# Patient Record
Sex: Male | Born: 1980 | Race: White | Hispanic: No | Marital: Married | Smoking: Never smoker
Health system: Southern US, Community
[De-identification: ages and names within clinical notes are randomized; demographics above are authoritative.]

## PROBLEM LIST (undated history)

## (undated) DIAGNOSIS — N289 Disorder of kidney and ureter, unspecified: Secondary | ICD-10-CM

## (undated) DIAGNOSIS — F32A Depression, unspecified: Secondary | ICD-10-CM

## (undated) DIAGNOSIS — F419 Anxiety disorder, unspecified: Secondary | ICD-10-CM

## (undated) DIAGNOSIS — E119 Type 2 diabetes mellitus without complications: Secondary | ICD-10-CM

## (undated) DIAGNOSIS — F329 Major depressive disorder, single episode, unspecified: Secondary | ICD-10-CM

## (undated) HISTORY — DX: Depression, unspecified: F32.A

## (undated) HISTORY — DX: Major depressive disorder, single episode, unspecified: F32.9

## (undated) HISTORY — DX: Anxiety disorder, unspecified: F41.9

## (undated) HISTORY — DX: Type 2 diabetes mellitus without complications: E11.9

---

## 2014-03-15 ENCOUNTER — Encounter (HOSPITAL_COMMUNITY): Payer: Self-pay

## 2014-03-15 ENCOUNTER — Emergency Department (HOSPITAL_COMMUNITY)
Admission: EM | Admit: 2014-03-15 | Discharge: 2014-03-15 | Disposition: A | Discharge status: Home / Self Care | Payer: Self-pay | Attending: Emergency Medicine | Admitting: Emergency Medicine

## 2014-03-15 DIAGNOSIS — Y9289 Other specified places as the place of occurrence of the external cause: Secondary | ICD-10-CM | POA: Insufficient documentation

## 2014-03-15 DIAGNOSIS — X58XXXA Exposure to other specified factors, initial encounter: Secondary | ICD-10-CM | POA: Insufficient documentation

## 2014-03-15 DIAGNOSIS — Y998 Other external cause status: Secondary | ICD-10-CM | POA: Insufficient documentation

## 2014-03-15 DIAGNOSIS — Y9389 Activity, other specified: Secondary | ICD-10-CM | POA: Insufficient documentation

## 2014-03-15 DIAGNOSIS — T162XXA Foreign body in left ear, initial encounter: Secondary | ICD-10-CM | POA: Insufficient documentation

## 2014-03-15 NOTE — ED Notes (Signed)
Pt presents with c/o foreign body in left ear. Pt reports 3 days pt lost the cotton end of a q-tip in his left ear. Pt reports he is starting to feel some pressure in that ear and his head as well now. Pt also c/o headache with his symptoms and som reduced hearing.

## 2014-03-15 NOTE — Discharge Instructions (Signed)
Ear Foreign Body An ear foreign body is an object that is stuck in the ear. It is common for young children to put objects into the ear canal. These may include pebbles, beads, beans, and any other small objects which will fit. In adults, objects such as cotton swabs may become lodged in the ear canal. In all ages, the most common foreign bodies are insects that enter the ear canal.  SYMPTOMS  Foreign bodies may cause pain, buzzing or roaring sounds, hearing loss, and ear drainage.  HOME CARE INSTRUCTIONS   Keep all follow-up appointments with your caregiver as told.  Keep small objects out of reach of young children. Tell them not to put anything in their ears. SEEK IMMEDIATE MEDICAL CARE IF:   You have bleeding from the ear.  You have increased pain or swelling of the ear.  You have reduced hearing.  You have discharge coming from the ear.  You have a fever.  You have a headache. MAKE SURE YOU:   Understand these instructions.  Will watch your condition.  Will get help right away if you are not doing well or get worse. Document Released: 03/27/2000 Document Revised: 06/22/2011 Document Reviewed: 11/16/2007 [facility] Patient Information 2015 [place] [place]. This information is not intended to replace advice given to you by your health care provider. Make sure you discuss any questions you have with your health care provider.  Eardrum Perforation The eardrum is a thin, round tissue inside the ear that separates the ear canal from the middle ear. This is the tissue that detects sound and enables you to hear. The eardrum can be punctured or torn (perforated). Eardrums generally heal without help and with little or no permanent hearing loss. CAUSES   Sudden pressure changes that happen in situations like scuba diving or flying in an airplane.  Foreign objects in the ear.  Inserting a cotton-tipped swab in the ear.  Loud noise.  Trauma to the ear. SYMPTOMS   Hearing  loss.  Ear pain.  Ringing in the ears.  Discharge or bleeding from the ear.  Dizziness.  Vomiting.  Facial paralysis. HOME CARE INSTRUCTIONS   Keep your ear dry, as this improves healing. Swimming, diving, and showers are not allowed until healing is complete. While bathing, protect the ear by placing a piece of cotton covered with petroleum jelly in the outer ear canal.  Only take over-the-counter or prescription medicines for pain, discomfort, or fever as directed by your caregiver.  Blow your nose gently. Forceful blowing increases the pressure in the middle ear and may cause further injury or delay healing.  Resume normal activities, such as showering, when the perforation has healed. Your caregiver can let you know when this has occurred.  Talk to your caregiver before flying on an airplane. Air travel is generally allowed with a perforated eardrum.  If your caregiver has given you a follow-up appointment, it is very important to keep that appointment. Failure to keep the appointment could result in a chronic or permanent injury, pain, hearing loss, and disability. SEEK IMMEDIATE MEDICAL CARE IF:   You have bleeding or pus coming from your ear.  You have problems with balance, dizziness, nausea, or vomiting.  You develop increased pain.  You have a fever. MAKE SURE YOU:   Understand these instructions.  Will watch your condition.  Will get help right away if you are not doing well or get worse. Document Released: 03/27/2000 Document Revised: 06/22/2011 Document Reviewed: 03/29/2008 Exit[employer] Patient Information  2015 [employer]. This information is not intended to replace advice given to you by your health care provider. Make sure you discuss any questions you have with your health care provider. ° °

## 2014-03-15 NOTE — ED Provider Notes (Signed)
CSN: [id number]     Arrival date & time 03/15/14  1954 History  This chart was scribed for non-physician practitioner working with Purvis SheffieldForrest Harrison, MD by Richarda Overlieichard Holland, ED Scribe. This patient was seen in room WTR5/WTR5 and the patient's care was started at 9:37 PM.    Chief Complaint  Patient presents with  . Foreign Body in Ear   The history is provided by the patient. No language interpreter was used.   HPI Comments: Christopher Moore is a 33 y.o. male who presents to the Emergency Department complaining of foreign body in his left ear. Pt reports he lost the cotton end of a q-tip in his left ear 3 days ago. He reports some left ear pressure in the ear and his head as well. He reports no pertinent past medical history. He reports no modifying or alleviating factors at this time. He denies fever.    History reviewed. No pertinent past medical history. History reviewed. No pertinent past surgical history. No family history on file. History  Substance Use Topics  . Smoking status: Never Smoker   . Smokeless tobacco: Not on file  . Alcohol Use: No    Review of Systems  Constitutional: Negative for fever.  HENT: Positive for ear pain. Negative for ear discharge.   All other systems reviewed and are negative.   Allergies  Review of patient's allergies indicates no known allergies.  Home Medications   Prior to Admission medications   Not on File   BP 132/77 mmHg  Pulse 96  Temp(Src) 98.2 F (36.8 C) (Oral)  Resp 16  SpO2 98% Physical Exam  Constitutional: He is oriented to person, place, and time. He appears well-developed and well-nourished.  HENT:  Head: Normocephalic and atraumatic.  Small fibers inside rt ear canal. Normal rt TM.  Left ear canal has cotton fibers blocking visualization of TM. No overt signs of infection, no erythema, drainage or warmth.   Neck: Neck supple. No tracheal deviation present.  Cardiovascular: Normal rate, regular rhythm and normal  heart sounds.   Pulmonary/Chest: Effort normal and breath sounds normal. No respiratory distress. He has no wheezes. He has no rales.  Abdominal: He exhibits no distension.  Neurological: He is alert and oriented to person, place, and time.  Skin: Skin is warm and dry.  Psychiatric: He has a normal mood and affect. His behavior is normal.  Nursing note and vitals reviewed.   ED Course  Procedures   DIAGNOSTIC STUDIES: Oxygen Saturation is 98% on RA, normal by my interpretation.    COORDINATION OF CARE: 9:41 PM Discussed treatment plan with pt at bedside and pt agreed to plan.   Labs Review Labs Reviewed - No data to display  Imaging Review No results found.   EKG Interpretation None      MDM  Christopher LowensteinJonathan L Noga is a 33 y.o. male who presents after losing the cotton tip of a Q-tip in his left ear.. Foreign body was removed by myself at bedside with alligator tweezers. Patient reports significant relief. Tympanic membrane visualized no evidence of rupture or infection. Advised appropriate ear hygiene, avoidance of Q-tips. Vital signs stable at this time, within normal limits, afebrile Patient stable, in good condition and is appropriate for discharge  Final diagnoses:  Foreign body in ear, left, initial encounter    I personally performed the services described in this documentation, which was scribed in my presence. The recorded information has been reviewed and is accurate.  Earle GellBenjamin W Ellingtonartner, PA-C 03/15/14 [id number]  Purvis SheffieldForrest Harrison, MD 03/16/14 [phone]

## 2015-09-14 ENCOUNTER — Ambulatory Visit (INDEPENDENT_AMBULATORY_CARE_PROVIDER_SITE_OTHER): Payer: BLUE CROSS/BLUE SHIELD | Admitting: Physician Assistant

## 2015-09-14 VITALS — BP 128/88 | HR 95 | Temp 97.9°F | Resp 18 | Ht 66.0 in | Wt 301.0 lb

## 2015-09-14 DIAGNOSIS — H6692 Otitis media, unspecified, left ear: Secondary | ICD-10-CM | POA: Diagnosis not present

## 2015-09-14 DIAGNOSIS — Z889 Allergy status to unspecified drugs, medicaments and biological substances status: Secondary | ICD-10-CM

## 2015-09-14 DIAGNOSIS — Z9109 Other allergy status, other than to drugs and biological substances: Secondary | ICD-10-CM | POA: Diagnosis not present

## 2015-09-14 MED ORDER — FLUTICASONE PROPIONATE 50 MCG/ACT NA SUSP
2.0000 | Freq: Every day | NASAL | Status: DC
Start: 2015-09-14 — End: 2019-04-18

## 2015-09-14 MED ORDER — CEFDINIR 300 MG PO CAPS: 300.0000 mg | ORAL_CAPSULE | Freq: Two times a day (BID) | ORAL | Status: AC

## 2015-09-14 MED ORDER — GUAIFENESIN ER 1200 MG PO TB12: 1.0000 | ORAL_TABLET | Freq: Two times a day (BID) | ORAL | Status: DC | PRN

## 2015-09-14 NOTE — Patient Instructions (Addendum)
     IF you received an x-ray today, you will receive an invoice from [facility]. Please contact [facility] at [phone] with questions or concerns regarding your invoice.   IF you received labwork today, you will receive an invoice from [facility]. Please contact [facility] at [phone] with questions or concerns regarding your invoice.   Our billing staff will not be able to assist you with questions regarding bills from these companies.  You will be contacted with the lab results as soon as they are available. The fastest way to get your results is to activate your My Chart account. Instructions are located on the last page of this paperwork. If you have not heard from us regarding the results in 2 weeks, please contact this office.     Allergies An allergy is when your body reacts to a substance in a way that is not normal. An allergic reaction can happen after you:  Eat something.  Breathe in something.  Touch something. WHAT KINDS OF ALLERGIES ARE THERE? You can be allergic to:  Things that are only around during certain seasons, like molds and pollens.  Foods.  Drugs.  Insects.  Animal dander. WHAT ARE SYMPTOMS OF ALLERGIES?  Puffiness (swelling). This may happen on the lips, face, tongue, mouth, or throat.  Sneezing.  Coughing.  Breathing loudly (wheezing).  Stuffy nose.  Tingling in the mouth.  A rash.  Itching.  Itchy, red, puffy areas of skin (hives).  Watery eyes.  Throwing up (vomiting).  Watery poop (diarrhea).  Dizziness.  Feeling faint or fainting.  Trouble breathing or swallowing.  A tight feeling in the chest.  A fast heartbeat. HOW ARE ALLERGIES DIAGNOSED? Allergies can be diagnosed with:  A medical and family history.  Skin tests.  Blood tests.  A food diary. A food diary is a record of all the foods, drinks, and symptoms you have each day.  The results of an  elimination diet. This diet involves making sure not to eat certain foods and then seeing what happens when you start eating them again. HOW ARE ALLERGIES TREATED? There is no cure for allergies, but allergic reactions can be treated with medicine. Severe reactions usually need to be treated at a hospital.  HOW CAN REACTIONS BE PREVENTED? The best way to prevent an allergic reaction is to avoid the thing you are allergic to. Allergy shots and medicines can also help prevent reactions in some cases.   This information is not intended to replace advice given to you by your health care provider. Make sure you discuss any questions you have with your health care provider.   Document Released: 07/25/2012 Document Revised: 04/20/2014 Document Reviewed: 01/09/2014 Elsevier Interactive Patient Education [employer]2016 [employer].

## 2015-10-10 NOTE — Progress Notes (Signed)
[facility] [address], [place] [address] [phone]  Date:  09/14/2015   Name:  Christopher Moore   DOB:  06/10/1980   MRN:  [id number]  PCP:  No primary care provider on file.    History of Present Illness:  Christopher Moore is a 35 y.o. male patient who [facility], and congestion, and sore throat. Patient has a nasal congestion, cough, and sore throat. He has had no history of fever or chills, but states that he has felt fatigue and malaise. Cough is nonproductive. Left-sided ear pain.    There are no active problems to display for this patient.   No past medical history on file.  No past surgical history on file.  Social History  Substance Use Topics  . Smoking status: Never Smoker   . Smokeless tobacco: None  . Alcohol Use: No    No family history on file.  No Known Allergies  Medication list has been reviewed and updated.  No current outpatient prescriptions on file prior to visit.   No current facility-administered medications on file prior to visit.    ROS ROS otherwise unremarkable unless listed above.   Physical Examination: BP 128/88 mmHg  Pulse 95  Temp(Src) 97.9 F (36.6 C) (Oral)  Resp 18  Ht 5\' 6"  (1.676 m)  Wt 301 lb (136.533 kg)  BMI 48.61 kg/m2  SpO2 98% Ideal Body Weight: Weight in (lb) to have BMI = 25: 154.6  Physical Exam  Constitutional: He is oriented to person, place, and time. He appears well-developed and well-nourished. No distress.  HENT:  Head: Atraumatic.  Right Ear: External ear and ear canal normal. Tympanic membrane is injected and bulging.  Left Ear: Tympanic membrane, external ear and ear canal normal. Tympanic membrane is not injected and not bulging.  Nose: Mucosal edema and rhinorrhea present. Right sinus exhibits no maxillary sinus tenderness and no frontal sinus tenderness. Left sinus exhibits no maxillary sinus tenderness and no frontal sinus tenderness.   Mouth/Throat: No uvula swelling. No oropharyngeal exudate, posterior oropharyngeal edema or posterior oropharyngeal erythema.  Eyes: Conjunctivae, EOM and lids are normal. Pupils are equal, round, and reactive to light. Right eye exhibits normal extraocular motion. Left eye exhibits normal extraocular motion.  Neck: Trachea normal and full passive range of motion without pain. No edema and no erythema present.  Cardiovascular: Normal rate.   Pulmonary/Chest: Effort normal. No respiratory distress. He has no decreased breath sounds. He has no wheezes. He has no rhonchi.  Neurological: He is alert and oriented to person, place, and time.  Skin: Skin is warm and dry. He is not diaphoretic.  Psychiatric: He has a normal mood and affect. His behavior is normal.     Assessment and Plan: Christopher Moore is a 35 y.o. male who is here today for left ear pain, and congestion. -treating allergies. And bacterial ear infection.   Multiple allergies - Plan: fluticasone (FLONASE) 50 MCG/ACT nasal spray, Guaifenesin (MUCINEX MAXIMUM STRENGTH) 1200 MG TB12, cefdinir (OMNICEF) 300 MG capsule  Subacute otitis media of left ear, recurrence not specified, unspecified otitis media type - Plan: fluticasone (FLONASE) 50 MCG/ACT nasal spray, Guaifenesin (MUCINEX MAXIMUM STRENGTH) 1200 MG TB12, cefdinir (OMNICEF) 300 MG capsule  Trena PlattStephanie English, PA-C [facility] 10/10/2015 3:19 PM

## 2015-10-30 ENCOUNTER — Ambulatory Visit (INDEPENDENT_AMBULATORY_CARE_PROVIDER_SITE_OTHER): Payer: BLUE CROSS/BLUE SHIELD | Admitting: Neurology

## 2015-10-30 ENCOUNTER — Encounter: Payer: Self-pay | Admitting: Neurology

## 2015-10-30 VITALS — BP 132/85 | HR 85 | Resp 20 | Ht 66.0 in | Wt 304.0 lb

## 2015-10-30 DIAGNOSIS — R51 Headache: Secondary | ICD-10-CM

## 2015-10-30 DIAGNOSIS — R0683 Snoring: Secondary | ICD-10-CM

## 2015-10-30 DIAGNOSIS — R519 Headache, unspecified: Secondary | ICD-10-CM

## 2015-10-30 DIAGNOSIS — G2581 Restless legs syndrome: Secondary | ICD-10-CM | POA: Diagnosis not present

## 2015-10-30 DIAGNOSIS — G4710 Hypersomnia, unspecified: Secondary | ICD-10-CM | POA: Diagnosis not present

## 2015-10-30 NOTE — Patient Instructions (Signed)

## 2015-10-30 NOTE — Progress Notes (Signed)
Subjective:    Patient ID: Christopher Moore is a 35 y.o. male.  HPI     Huston FoleySaima Oaklan Persons, MD, PhD [facility] [address], [address] [address] [place], [place] [address]  Dear Dr. Everlene OtherBouska,   I saw your patient, Christopher Moore, upon your kind request in my neurologic clinic today for initial consultation of his sleep disorder, in particular, concern for underlying obstructive sleep apnea. The patient is unaccompanied today. As you know, Christopher Moore is a 35 year old right-handed gentleman with an underlying medical history of allergies, hyperlipidemia, and morbid obesity, who reports snoring and excessive daytime somnolence.  I reviewed your office note from 10/03/2015, which you kindly included. He was recently restarted on metformin. Previously, he reports that he had side effects on it. He also reports postnasal drip he does not smoke but has been exposed to secondhand smoke. He lives with his wife and his mother, there are 2 dogs and 1 cat in the household.  His bedtime is around 1-2 AM, wake time is 8-9 AM.  He does watch TV in bed, but turns it off.  He drinks quite a bit of caffeine about 15 ounces, he says that he actually cut back. His weight fluctuates but generally stays in the high to 90s to low 300s. He is not aware of any family history of OSA. He denies nocturia on a night to night basis. He does have occasional restless leg symptoms. He is not sure if he twitches his legs in his sleep. He has occasional to rare morning headaches.  His Epworth sleepiness score is 7 out of 24 today, his fatigue score is 37 out of 63. He does admit that sometimes when he is sedentary he will doze off. He is familiar with the sleep apnea diagnosis as his wife has OSA and uses a CPAP machine. He would be willing to try CPAP for himself as well.   His Past Medical History Is Significant For: Past Medical History  Diagnosis Date  . Depression   . Anxiety   . Diabetes mellitus  without complication (HCC)     His Past Surgical History Is Significant For: No past surgical history on file.  His Family History Is Significant For: Family History  Problem Relation Age of Onset  . Heart disease Mother   . Cancer Father     His Social History Is Significant For: Social History   Social History  . Marital Status: Married    Spouse Name: N/A  . Number of Children: 0  . Years of Education: College   Social History Main Topics  . Smoking status: Never Smoker   . Smokeless tobacco: None  . Alcohol Use: No  . Drug Use: No  . Sexual Activity: Not Asked   Other Topics Concern  . None   Social History Narrative   20+oz caffeine a day     His Allergies Are:  No Known Allergies:   His Current Medications Are:  Outpatient Encounter Prescriptions as of 10/30/2015  Medication Sig  . fluticasone (FLONASE) 50 MCG/ACT nasal spray Place 2 sprays into both nostrils daily.  Marland Kitchen. loratadine (CLARITIN) 10 MG tablet Take 10 mg by mouth.  . metFORMIN (GLUCOPHAGE) 500 MG tablet Take 500 mg by mouth. Reported on 10/30/2015  . [DISCONTINUED] Guaifenesin (MUCINEX MAXIMUM STRENGTH) 1200 MG TB12 Take 1 tablet (1,200 mg total) by mouth every 12 (twelve) hours as needed.   No facility-administered encounter medications on file as of 10/30/2015.  :  Review of Systems:  Out of a complete 14 point review of systems, all are reviewed and negative with the exception of these symptoms as listed below:   Review of Systems  Neurological:       No trouble falling or staying asleep, snoring, wakes up feeling tired, daytime tiredness, morning headaches, will fall asleep at work if slow. Dry mouth.  Wife has OSA and CPAP.    Epworth Sleepiness Scale 0= would never doze 1= slight chance of dozing 2= moderate chance of dozing 3= high chance of dozing  Sitting and reading:1 Watching TV:1 Sitting inactive in a public place (ex. Theater or meeting):2 As a passenger in a car for an hour  without a break:0 Lying down to rest in the afternoon:2 Sitting and talking to someone:0 Sitting quietly after lunch (no alcohol):1 In a car, while stopped in traffic:0 Total: 7  Objective:  Neurologic Exam  Physical Exam Physical Examination:   Filed Vitals:   10/30/15 1455  BP: 132/85  Pulse: 85  Resp: 20    General Examination: The patient is a very pleasant 35 y.o. male in no acute distress. He appears well-developed and well-nourished and adequately groomed.   HEENT: Normocephalic, atraumatic, pupils are equal, round and reactive to light and accommodation. Funduscopic exam is normal with sharp disc margins noted. Extraocular tracking is good without limitation to gaze excursion or nystagmus noted. Normal smooth pursuit is noted. Hearing is grossly intact. Tympanic membranes are clear bilaterally. Face is symmetric with normal facial animation and normal facial sensation. Speech is clear with no dysarthria noted. There is no hypophonia. There is no lip, neck/head, jaw or voice tremor. Neck is supple with full range of passive and active motion. There are no carotid bruits on auscultation. Oropharynx exam reveals: mild mouth dryness, adequate to marginal dental hygiene with teeth missing and a moderate to significantly crowded airway, due to thicker tongue, larger uvula and tonsils in place, not fully visible tonsils. Mallampati is class III. Tongue protrudes centrally and palate elevates symmetrically. Neck size is 21 1/8 inches.  Chest: Clear to auscultation without wheezing, rhonchi or crackles noted.  Heart: S1+S2+0, regular and normal without murmurs, rubs or gallops noted.   Abdomen: Soft, non-tender and non-distended with normal bowel sounds appreciated on auscultation.  Extremities: There is no pitting edema in the distal lower extremities bilaterally. Pedal pulses are intact.  Skin: Warm and dry without trophic changes noted. There are no varicose  veins.  Musculoskeletal: exam reveals no obvious joint deformities, tenderness or joint swelling or erythema.   Neurologically:  Mental status: The patient is awake, alert and oriented in all 4 spheres. His immediate and remote memory, attention, language skills and fund of knowledge are appropriate. There is no evidence of aphasia, agnosia, apraxia or anomia. Speech is clear with normal prosody and enunciation. Thought process is linear. Mood is normal and affect is normal.  Cranial nerves II - XII are as described above under HEENT exam. In addition: shoulder shrug is normal with equal shoulder height noted. Motor exam: Normal bulk, strength and tone is noted. There is no drift, tremor or rebound. Romberg is negative. Reflexes are 2+ throughout. Fine motor skills and coordination: intact with normal finger taps, normal hand movements, normal rapid alternating patting, normal foot taps and normal foot agility.  Cerebellar testing: No dysmetria or intention tremor on finger to nose testing. Heel to shin is unremarkable bilaterally. There is no truncal or gait ataxia.  Sensory exam: intact to  light touch, pinprick, vibration, temperature sense in the upper and lower extremities.  Gait, station and balance: He stands easily. No veering to one side is noted. No leaning to one side is noted. Posture is age-appropriate and stance is narrow based. Gait shows normal stride length and normal pace. No problems turning are noted. Tandem walk is unremarkable.  Assessment and Plan:  In summary, DAVAN NAWABI is a very pleasant 35 y.o.-year old male with an underlying medical history of allergies, hyperlipidemia, and morbid obesity, whose history and physical exam are concerning for obstructive sleep apnea (OSA). I had a long chat with the patient about my findings and the diagnosis of OSA, its prognosis and treatment options. We talked about medical treatments, surgical interventions and non-pharmacological  approaches. I explained in particular the risks and ramifications of untreated moderate to severe OSA, especially with respect to developing cardiovascular disease down the Road, including congestive heart failure, difficult to treat hypertension, cardiac arrhythmias, or stroke. Even type 2 diabetes has, in part, been linked to untreated OSA. Symptoms of untreated OSA include daytime sleepiness, memory problems, mood irritability and mood disorder such as depression and anxiety, lack of energy, as well as recurrent headaches, especially morning headaches. We talked about trying to maintain a healthy lifestyle in general, as well as the importance of weight control. I encouraged the patient to eat healthy, exercise daily and keep well hydrated, to keep a scheduled bedtime and wake time routine, to not skip any meals and eat healthy snacks in between meals. I advised the patient not to drive when feeling sleepy. I recommended the following at this time: sleep study with potential positive airway pressure titration. (We will score hypopneas at 3% and split the sleep study into diagnostic and treatment portion, if the estimated. 2 hour AHI is >15/h).   I explained the sleep test procedure to the patient and also outlined possible surgical and non-surgical treatment options of OSA, including the use of a custom-made dental device (which would require a referral to a specialist dentist or oral surgeon), upper airway surgical options, such as pillar implants, radiofrequency surgery, tongue base surgery, and UPPP (which would involve a referral to an ENT surgeon). Rarely, jaw surgery such as mandibular advancement may be considered.  I also explained the CPAP treatment option to the patient, who indicated that he would be willing to try CPAP if the need arises. I explained the importance of being compliant with PAP treatment, not only for insurance purposes but primarily to improve His symptoms, and for the patient's  long term health benefit, including to reduce His cardiovascular risks. I answered all his questions today and the patient was in agreement. I would like to see him back after the sleep study is completed and encouraged him to call with any interim questions, concerns, problems or updates.   Thank you very much for allowing me to participate in the care of this nice patient. If I can be of any further assistance to you please do not hesitate to call me at [phone].  Sincerely,   Huston Foley, MD, PhD

## 2016-07-09 ENCOUNTER — Emergency Department (HOSPITAL_COMMUNITY)
Admission: EM | Admit: 2016-07-09 | Discharge: 2016-07-10 | Disposition: A | Discharge status: Home / Self Care | Payer: BLUE CROSS/BLUE SHIELD | Attending: Emergency Medicine | Admitting: Emergency Medicine

## 2016-07-09 ENCOUNTER — Emergency Department (HOSPITAL_COMMUNITY): Payer: BLUE CROSS/BLUE SHIELD

## 2016-07-09 ENCOUNTER — Encounter (HOSPITAL_COMMUNITY): Payer: Self-pay | Admitting: *Deleted

## 2016-07-09 DIAGNOSIS — E119 Type 2 diabetes mellitus without complications: Secondary | ICD-10-CM | POA: Diagnosis not present

## 2016-07-09 DIAGNOSIS — J069 Acute upper respiratory infection, unspecified: Secondary | ICD-10-CM

## 2016-07-09 MED ORDER — BENZONATATE 100 MG PO CAPS: 100.0000 mg | ORAL_CAPSULE | Freq: Three times a day (TID) | ORAL | 0 refills | Status: DC

## 2016-07-09 MED ORDER — AZITHROMYCIN 250 MG PO TABS: 250.0000 mg | ORAL_TABLET | Freq: Every day | ORAL | 0 refills | Status: DC

## 2016-07-09 NOTE — ED Triage Notes (Signed)
Pt complains of cough, chills, and body aches for the past couple of days. Pt states he has been around a lot of sick people lately. Pt has pain in left abdomen when coughing.

## 2016-07-09 NOTE — Discharge Instructions (Signed)
Take antibiotics as prescribed.   Use tessalon perles for relief of cough.  Return to the ED for any worsening pain, chest pain, difficulty breathing, lightheadedness or any other worsening concerns.      RESOURCE GUIDE  Dental Problems  Patients with Medicaid: [facility]                     [facility] [phone]                                           [phone] Phone:  [phone]                                                  Phone:  [phone]  If unable to pay or uninsured, contact:  [facility] or [facility] to become qualified for the adult dental clinic.  Chronic Pain Problems Contact [facility]  [phone] Patients need to be referred by their primary care doctor.  Insufficient Money for Medicine Contact [employer]:  call "211" or [facility] [phone].  No Primary Care Doctor Call [facility]  [phone] Other agencies that provide inexpensive medical care    [facility]  [phone]    [facility]  [phone]    [facility]  [phone]    [facility]  [phone]    [facility]  [phone]    [facility]  [phone]  Psychological Services [facility]  [phone] [facility]  [phone] [facility]   [phone] (emergency services [phone])  Substance Abuse Resources [facility]  [phone] [facility] [phone] [facility] [phone] [facility] [phone] Residential & Outpatient Substance Abuse Program  [phone]  Abuse/Neglect [facility] Hotline [phone] [facility] Hotline [phone] (After Hours)  Emergency Shelter [facility] [phone]  [facility] [place] [phone] [facility] [phone]  MRSA Hotline #:    908-319-3812575 [phone]    [facility]  [facility]     [employer]                          [facility] [address] [place]                       [address]      [address] [place] [address]  [place]                                                [facility]                            [place] Phone:  [phone]  Phone:  [phone]                 Phone:  [phone]  [facility] Phone:  [phone]  [facility] Child [facility] Hotline [phone] (After Hours)

## 2016-07-09 NOTE — ED Provider Notes (Signed)
[facility]-EMERGENCY DEPT Provider Note   CSN: [id number] Arrival date & time: 07/09/16  2143     History   Chief Complaint Chief Complaint  Patient presents with  . URI    HPI Christopher Moore is a 36 y.o. male who presents with 2 days of worsening cough, congestion, sore throat and runny nose. Patient states that cough is productive with clear/yellow sputum. He denies any aggravating factors of the cough. He has tried OTC Theraflu and cough drops with no improvement. He reports that yesterday he started having some abdominal soreness/side pain when coughing and a "dull ache" chest pain. Patient reports that he has been around a lot of sick contacts recently. He denies any fevers, nausea/vomiting, SOB.   The history is provided by the patient.    Past Medical History:  Diagnosis Date  . Anxiety   . Depression   . Diabetes mellitus without complication (HCC)     There are no active problems to display for this patient.   No past surgical history on file.     Home Medications    Prior to Admission medications   Medication Sig Start Date End Date Taking? Authorizing Provider  azithromycin (ZITHROMAX) 250 MG tablet Take 1 tablet (250 mg total) by mouth daily. Take first 2 tablets together, then 1 every day until finished. 07/09/16   Westley Foots, PA  benzonatate (TESSALON) 100 MG capsule Take 1 capsule (100 mg total) by mouth every 8 (eight) hours. 07/09/16   Westley Foots, PA  fluticasone (FLONASE) 50 MCG/ACT nasal spray Place 2 sprays into both nostrils daily. 09/14/15   Collie Siad English, PA  loratadine (CLARITIN) 10 MG tablet Take 10 mg by mouth.    Historical Provider, MD  metFORMIN (GLUCOPHAGE) 500 MG tablet Take 500 mg by mouth. Reported on 10/30/2015 10/04/15 10/03/16  Historical Provider, MD    Family History Family History  Problem Relation Age of Onset  . Heart disease Mother   . Cancer Father     Social History Social History  Substance Use Topics    . Smoking status: Never Smoker  . Smokeless tobacco: Not on file  . Alcohol use No     Allergies   Patient has no known allergies.   Review of Systems Review of Systems  Constitutional: Negative for chills and fever.  HENT: Positive for congestion, rhinorrhea and sore throat. Negative for ear pain.   Respiratory: Positive for cough. Negative for shortness of breath.   Cardiovascular: Positive for chest pain.  Gastrointestinal: Negative for abdominal pain, diarrhea, nausea and vomiting.  Genitourinary: Negative for dysuria.  Musculoskeletal: Negative for neck pain.  Neurological: Negative for headaches.  All other systems reviewed and are negative.    Physical Exam Updated Vital Signs BP (!) 153/95 (BP Location: Right Arm)   Pulse 91   Temp 98.5 F (36.9 C) (Oral)   Resp 18   Ht 5\' 6"  (1.676 m)   Wt 136.1 kg   SpO2 94%   BMI 48.42 kg/m   Physical Exam  Constitutional: He is oriented to person, place, and time. He appears well-developed and well-nourished.  HENT:  Head: Normocephalic and atraumatic.  Nose: Mucosal edema and rhinorrhea present.  Mouth/Throat: Oropharynx is clear and moist and mucous membranes are normal. No oropharyngeal exudate or posterior oropharyngeal erythema.  Eyes: Conjunctivae and EOM are normal. Pupils are equal, round, and reactive to light. Right eye exhibits no discharge. Left eye exhibits no discharge. No scleral icterus.  Cardiovascular: Normal rate, regular rhythm and intact distal pulses.   Pulmonary/Chest: Effort normal and breath sounds normal.  Musculoskeletal tenderness to palpation of left anterior chest wall and left side. No deformities noted.   Musculoskeletal: Normal range of motion. He exhibits no deformity.       Arms: Subjective pain to left intercostal muscles with abduction of BUE and rotation of torso. No bony tenderness   Neurological: He is alert and oriented to person, place, and time.  Skin: Skin is warm and dry.   Psychiatric: He has a normal mood and affect. His speech is normal and behavior is normal.     ED Treatments / Results  Labs (all labs ordered are listed, but only abnormal results are displayed) Labs Reviewed - No data to display  EKG  EKG Interpretation  Date/Time:  Thursday July 09 2016 22:49:31 EDT Ventricular Rate:  97 PR Interval:    QRS Duration: 108 QT Interval:  339 QTC Calculation: 431 R Axis:   -14 Text Interpretation:  Sinus rhythm RSR' in V1 or V2, right VCD or RVH No acute changes No old tracing to compare Confirmed by NANAVATI, MD, ANKIT [phone]([id number]) on 07/10/2016 12:06:45 AM Also confirmed by NANAVATI, MD, ANKIT [phone]([id number]), editor [place]  CCT, BEVERLY ([id number])  on 07/10/2016 7:43:18 AM      EKG: normal EKG, normal sinus rhythm. No ST elevations.    Radiology Dg Chest 2 View  Result Date: 07/09/2016 CLINICAL DATA:  Acute onset of cough and left-sided chest pain. Initial encounter. EXAM: CHEST  2 VIEW COMPARISON:  None. FINDINGS: The lungs are well-aerated. Mild vascular congestion is noted. There is no evidence of focal opacification, pleural effusion or pneumothorax. The heart is normal in size; the mediastinal contour is within normal limits. No acute osseous abnormalities are seen. IMPRESSION: Mild vascular congestion noted.  Lungs remain grossly clear. Electronically Signed   By: Roanna RaiderJeffery  Chang M.D.   On: 07/09/2016 22:42    Procedures Procedures (including critical care time)  Medications Ordered in ED Medications - No data to display   Initial Impression / Assessment and Plan / ED Course  I have reviewed the triage vital signs and the nursing notes.  Pertinent labs & imaging results that were available during my care of the patient were reviewed by me and considered in my medical decision making (see chart for details).    36 yo M with 2 days of URI symptoms. No improvement with OTC meds. Consider URI vs bronchitis. Low suspicion for pneumonia given  history/physical but still a consideration with productive cough. Will eval CXR for any focal consolidation. Will check EKG given history of CP.   Imaging and EKG reviewed. CXR negative for any acute process. EKG with NSR with no ST elevations. CP likely a result from muscular strain secondary to coughing. Patient stable for discharge at this time. Advised patient to take NSAIDs as needed for the pain. Instructed patient to follow-up with a primary care doctor. 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 Return precautions discussed. Patient expresses understanding and agreement to plan.   Final Clinical Impressions(s) / ED Diagnoses   Final diagnoses:  Upper respiratory tract infection, unspecified type    New Prescriptions Discharge Medication List as of 07/10/2016 12:00 AM    START taking these medications   Details  azithromycin (  ZITHROMAX) 250 MG tablet Take 1 tablet (250 mg total) by mouth daily. Take first 2 tablets together, then 1 every day until finished., Starting Thu 07/09/2016, Print    benzonatate (TESSALON) 100 MG capsule Take 1 capsule (100 mg total) by mouth every 8 (eight) hours., Starting Thu 07/09/2016, Print         Maxwell Caul, PA-C 07/10/16 1504    Derwood Kaplan, MD 07/10/16 [phone]

## 2016-07-20 ENCOUNTER — Telehealth: Payer: Self-pay

## 2016-07-20 NOTE — Telephone Encounter (Signed)
01/10/16: Pt dos not want to schedule sleep study. States he cannot afford it.

## 2017-12-14 ENCOUNTER — Encounter: Payer: Self-pay | Admitting: Dietician

## 2017-12-14 ENCOUNTER — Encounter: Payer: BLUE CROSS/BLUE SHIELD | Attending: Family Medicine | Admitting: Dietician

## 2017-12-14 DIAGNOSIS — E119 Type 2 diabetes mellitus without complications: Secondary | ICD-10-CM | POA: Diagnosis not present

## 2017-12-14 DIAGNOSIS — Z713 Dietary counseling and surveillance: Secondary | ICD-10-CM | POA: Diagnosis present

## 2017-12-14 DIAGNOSIS — Z6841 Body Mass Index (BMI) 40.0 and over, adult: Secondary | ICD-10-CM | POA: Insufficient documentation

## 2017-12-14 NOTE — Progress Notes (Signed)
Patient was seen on 12/14/17 for the first of a series of three diabetes self-management courses at [facility]  Patient Education Plan per assessed needs and concerns is to attend three course education program for Diabetes Self Management Education.  The following learning objectives were met by the patient during this class:  Describe diabetes  State some common risk factors for diabetes  Defines the role of glucose and insulin  Identifies type of diabetes and pathophysiology  Describe the relationship between diabetes and cardiovascular risk  State the members of the Healthcare Team  States the rationale for glucose monitoring  State when to test glucose  State their individual Target Range  State the importance of logging glucose readings  Describe how to interpret glucose readings  Identifies A1C target  Explain the correlation between A1c and eAG values  State symptoms and treatment of high blood glucose  State symptoms and treatment of low blood glucose  Explain proper technique for glucose testing  Identifies proper sharps disposal  Handouts given during class include:  Living Well with Diabetes  Carb Counting and Meal Planning book  Meal Plan Card  Meal planning worksheet  Low Sodium Flavoring Tips  Types of Fats  The diabetes portion plate  M4Q to eAG Conversion Chart  Diabetes Recommended Care Schedule  Support Group  Diabetes Success Plan  Core Class Satisfaction Survey   Follow-Up Plan:  Attend core 2

## 2017-12-21 ENCOUNTER — Encounter: Payer: BLUE CROSS/BLUE SHIELD | Admitting: Dietician

## 2017-12-21 DIAGNOSIS — Z713 Dietary counseling and surveillance: Secondary | ICD-10-CM | POA: Diagnosis not present

## 2017-12-21 DIAGNOSIS — E119 Type 2 diabetes mellitus without complications: Secondary | ICD-10-CM

## 2017-12-22 ENCOUNTER — Encounter: Payer: Self-pay | Admitting: Dietician

## 2017-12-22 NOTE — Progress Notes (Signed)
Patient was seen on 12/21/17 for the second of a series of three diabetes self-management courses at the [facility]. The following learning objectives were met by the patient during this class:   Describe the role of different macronutrients on glucose  Explain how carbohydrates affect blood glucose  State what foods contain the most carbohydrates  Demonstrate carbohydrate counting  Demonstrate how to read Nutrition Facts food label  Describe effects of various fats on heart health  Describe the importance of good nutrition for health and healthy eating strategies  Describe techniques for managing your shopping, cooking and meal planning  List strategies to follow meal plan when dining out  Describe the effects of alcohol on glucose and how to use it safely  Goals:  Follow Diabetes Meal Plan as instructed  Aim to spread carbs evenly throughout the day  Aim for 3 meals per day and snacks as needed Include lean protein foods to meals/snacks  Monitor glucose levels as instructed by your doctor   Follow-Up Plan:  Attend Core 3  Work towards following your personal food plan.

## 2017-12-28 ENCOUNTER — Encounter: Payer: Self-pay | Admitting: Dietician

## 2017-12-28 ENCOUNTER — Encounter: Payer: BLUE CROSS/BLUE SHIELD | Admitting: Dietician

## 2017-12-28 DIAGNOSIS — E119 Type 2 diabetes mellitus without complications: Secondary | ICD-10-CM

## 2017-12-28 DIAGNOSIS — Z713 Dietary counseling and surveillance: Secondary | ICD-10-CM | POA: Diagnosis not present

## 2017-12-28 NOTE — Progress Notes (Signed)
Patient was seen on 12/28/17 for the third of a series of three diabetes self-management courses at the [facility].   Janene Madeira. State the amount of activity recommended for healthy living . Describe activities suitable for individual needs . Identify ways to regularly incorporate activity into daily life . Identify barriers to activity and ways to over come these barriers  Identify diabetes medications being personally used and their primary action for lowering glucose and possible side effects . Describe role of stress on blood glucose and develop strategies to address psychosocial issues . Identify diabetes complications and ways to prevent them  Explain how to manage diabetes during illness . Evaluate success in meeting personal goal . Establish 2-3 goals that they will plan to diligently work on  Goals:   I will count my carb choices at most meals and snacks  I will eat less unhealthy fats  To help manage stress  Your patient has identified these potential barriers to change:  Finances Stress  Your patient has identified their diabetes self-care support plan as  Family Support   Plan:  Attend Support Group as desired

## 2018-03-14 ENCOUNTER — Encounter: Payer: Self-pay | Admitting: Internal Medicine

## 2019-04-17 ENCOUNTER — Encounter (HOSPITAL_COMMUNITY): Payer: Self-pay

## 2019-04-17 ENCOUNTER — Emergency Department (HOSPITAL_COMMUNITY): Payer: Self-pay

## 2019-04-17 ENCOUNTER — Other Ambulatory Visit: Payer: Self-pay

## 2019-04-17 DIAGNOSIS — Z79899 Other long term (current) drug therapy: Secondary | ICD-10-CM | POA: Insufficient documentation

## 2019-04-17 DIAGNOSIS — N23 Unspecified renal colic: Secondary | ICD-10-CM | POA: Insufficient documentation

## 2019-04-17 DIAGNOSIS — Z7984 Long term (current) use of oral hypoglycemic drugs: Secondary | ICD-10-CM | POA: Insufficient documentation

## 2019-04-17 DIAGNOSIS — E119 Type 2 diabetes mellitus without complications: Secondary | ICD-10-CM | POA: Insufficient documentation

## 2019-04-17 DIAGNOSIS — I10 Essential (primary) hypertension: Secondary | ICD-10-CM | POA: Insufficient documentation

## 2019-04-17 LAB — CBC WITH DIFFERENTIAL/PLATELET
Abs Immature Granulocytes: 0.06 10*3/uL (ref 0.00–0.07)
Basophils Absolute: 0.1 10*3/uL (ref 0.0–0.1)
Basophils Relative: 0 %
Eosinophils Absolute: 0 10*3/uL (ref 0.0–0.5)
Eosinophils Relative: 0 %
HCT: 47.7 % (ref 39.0–52.0)
Hemoglobin: 16.2 g/dL (ref 13.0–17.0)
Immature Granulocytes: 1 %
Lymphocytes Relative: 6 %
Lymphs Abs: 0.8 10*3/uL (ref 0.7–4.0)
MCH: 28.4 pg (ref 26.0–34.0)
MCHC: 34 g/dL (ref 30.0–36.0)
MCV: 83.5 fL (ref 80.0–100.0)
Monocytes Absolute: 0.7 10*3/uL (ref 0.1–1.0)
Monocytes Relative: 6 %
Neutro Abs: 11.3 10*3/uL — ABNORMAL HIGH (ref 1.7–7.7)
Neutrophils Relative %: 87 %
Platelets: 215 10*3/uL (ref 150–400)
RBC: 5.71 MIL/uL (ref 4.22–5.81)
RDW: 12.8 % (ref 11.5–15.5)
WBC: 13 10*3/uL — ABNORMAL HIGH (ref 4.0–10.5)
nRBC: 0 % (ref 0.0–0.2)

## 2019-04-17 LAB — COMPREHENSIVE METABOLIC PANEL
ALT: 24 U/L (ref 0–44)
AST: 22 U/L (ref 15–41)
Albumin: 4.4 g/dL (ref 3.5–5.0)
Alkaline Phosphatase: 67 U/L (ref 38–126)
Anion gap: 13 (ref 5–15)
BUN: 14 mg/dL (ref 6–20)
CO2: 22 mmol/L (ref 22–32)
Calcium: 9.6 mg/dL (ref 8.9–10.3)
Chloride: 101 mmol/L (ref 98–111)
Creatinine, Ser: 1.12 mg/dL (ref 0.61–1.24)
GFR calc Af Amer: >60 mL/min (ref >60)
GFR calc non Af Amer: >60 mL/min (ref >60)
Glucose, Bld: 216 mg/dL — ABNORMAL HIGH (ref 70–99)
Potassium: 3.9 mmol/L (ref 3.5–5.1)
Sodium: 136 mmol/L (ref 135–145)
Total Bilirubin: 1.3 mg/dL — ABNORMAL HIGH (ref 0.3–1.2)
Total Protein: 7.9 g/dL (ref 6.5–8.1)

## 2019-04-17 LAB — LIPASE, BLOOD: Lipase: 18 U/L (ref 11–51)

## 2019-04-17 NOTE — ED Triage Notes (Signed)
Patient c/o intermittent right flank pain x 2 days. Patient states he was having urinary frequency, but none today.  Patient is non compliant with his medications. patient states "I can not get on a schedule."

## 2019-04-17 NOTE — ED Notes (Signed)
Patient transported to CT 

## 2019-04-17 NOTE — ED Provider Notes (Signed)
   Patient placed in Quick Look pathway, seen and evaluated   Chief Complaint: Flank pain  HPI:   Christopher Moore is a 39 y.o. male, presenting to the ED with right flank pain beginning 2 days ago.  Pain is aching, waxing and waning with sharp stabs of pain located in the right flank, right lower back, and right side of the abdomen.  Accompanied by nausea.  Denies shortness of breath, chest pain, vomiting, diarrhea, hematuria, dysuria, fever, weakness, numbness.  ROS: Flank pain   Physical Exam:   Gen: No distress  Neuro: Awake and Alert  Skin: Warm    Focused Exam:   No diaphoresis.  No pallor.  Pulmonary: No increased work of breathing.  Speaks in full sentences without difficulty.  No tachypnea.  Cardiac: Normal rate and regular. Peripheral pulses intact.  Abdominal: Patient tender in the right upper quadrant of the abdomen as well as the right flank.  No peritoneal signs.  No rebound tenderness.  No guarding.  No CVA tenderness.  MSK: No peripheral edema.   Initiation of care has begun. The patient has been counseled on the process, plan, and necessity for staying for the completion/evaluation, and the remainder of the medical screening examination   Concepcion Living 04/17/19 1708    Terrilee Files, MD 04/18/19 [phone]

## 2019-04-18 ENCOUNTER — Emergency Department (HOSPITAL_COMMUNITY)
Admission: EM | Admit: 2019-04-18 | Discharge: 2019-04-18 | Disposition: A | Discharge status: Home / Self Care | Payer: Self-pay | Attending: Emergency Medicine | Admitting: Emergency Medicine

## 2019-04-18 DIAGNOSIS — N23 Unspecified renal colic: Secondary | ICD-10-CM

## 2019-04-18 DIAGNOSIS — R10811 Right upper quadrant abdominal tenderness: Secondary | ICD-10-CM

## 2019-04-18 LAB — URINALYSIS, ROUTINE W REFLEX MICROSCOPIC
Bilirubin Urine: NEGATIVE
Glucose, UA: 50 mg/dL — AB
Ketones, ur: 20 mg/dL — AB
Nitrite: NEGATIVE
Protein, ur: 100 mg/dL — AB
Specific Gravity, Urine: 1.029 (ref 1.005–1.030)
pH: 5 (ref 5.0–8.0)

## 2019-04-18 MED ORDER — TAMSULOSIN HCL 0.4 MG PO CAPS: 0.4000 mg | ORAL_CAPSULE | Freq: Every day | ORAL | 0 refills | Status: DC

## 2019-04-18 MED ORDER — OXYCODONE-ACETAMINOPHEN 5-325 MG PO TABS: 1.0000 | ORAL_TABLET | Freq: Four times a day (QID) | ORAL | 0 refills | Status: DC | PRN

## 2019-04-18 MED ORDER — KETOROLAC TROMETHAMINE 15 MG/ML IJ SOLN
15.0000 mg | Freq: Once | INTRAMUSCULAR | Status: AC
  Administered 2019-04-18: 07:00:00 15 mg via INTRAVENOUS
  Filled 2019-04-18: qty 1

## 2019-04-18 MED ORDER — OXYCODONE-ACETAMINOPHEN 5-325 MG PO TABS
1.0000 | ORAL_TABLET | Freq: Once | ORAL | Status: AC
Start: 2019-04-18 — End: 2019-04-18
  Administered 2019-04-18: 07:00:00 1 via ORAL
  Filled 2019-04-18: qty 1

## 2019-04-18 MED ORDER — ONDANSETRON 4 MG PO TBDP: 4.0000 mg | ORAL_TABLET | Freq: Three times a day (TID) | ORAL | 0 refills | Status: DC | PRN

## 2019-04-18 NOTE — Discharge Instructions (Signed)
Please read and follow all provided instructions.  Your diagnoses today include:  1. Ureteral colic   2. RUQ abdominal tenderness     Tests performed today include:  Urine test that showed blood in your urine and no infection  CT scan which showed two kidney stones on the right side  Blood test that showed normal kidney function  Urine test - shows some red cells and white cells   Vital signs. See below for your results today.   Medications prescribed:   Percocet (oxycodone/acetaminophen) - narcotic pain medication  DO NOT drive or perform any activities that require you to be awake and alert because this medicine can make you drowsy. BE VERY CAREFUL not to take multiple medicines containing Tylenol (also called acetaminophen). Doing so can lead to an overdose which can damage your liver and cause liver failure and possibly death.   Flomax (tamsulosin) - relaxes smooth muscle to help kidney stones pass   Zofran (ondansetron) - for nausea and vomiting  Take any prescribed medications only as directed.  Home care instructions:  Follow any educational materials contained in this packet.  Please double your fluid intake for the next several days. Strain your urine and save any stones that may pass.   BE VERY CAREFUL not to take multiple medicines containing Tylenol (also called acetaminophen). Doing so can lead to an overdose which can damage your liver and cause liver failure and possibly death.   Follow-up instructions: Please follow-up with your urologist or the urologist referral (provided on front page) in the next 1 week for further evaluation of your symptoms.  If you need to return to the Emergency Department, go to [facility] and not [facility]. The urologists are located at [facility] and can better care for you at this location.  Return instructions:  If you need to return to the Emergency Department, go to [facility] and not [facility]. The urologists are located at [facility] and can better care for you at this location.   Please return to the Emergency Department if you experience worsening symptoms.  Please return if you develop fever or uncontrolled pain or vomiting.  Please return if you have any other emergent concerns.  Additional Information:  Your vital signs today were: BP (!) 166/109 (BP Location: Right Arm)   Pulse (!) 116   Temp 98.4 F (36.9 C) (Oral)   Resp (!) 22   Ht 5' 6.5" (1.689 m)   Wt 131.4 kg   SpO2 98%   BMI 46.04 kg/m  If your blood pressure (BP) was elevated above 135/85 this visit, please have this repeated by your doctor within one month. --------------

## 2019-04-18 NOTE — ED Provider Notes (Signed)
[facility]   CSN: [id number] Arrival date & time: 04/17/19  1559     History Chief Complaint  Patient presents with  . Flank Pain  . Hypertension    Christopher Moore is a 39 y.o. male.  Patient with history of diabetes presents to the emergency department with complaint of right-sided flank pain.  Symptoms began 3 days ago.  They have been waxing and waning since that time.  Pain is in the right flank.  No fevers.  Pain radiates into the abdomen.  No shortness of breath, chest pain, vomiting, diarrhea.  No dysuria, hematuria, increased frequency or urgency.  Patient has never had a kidney stone in the past.  Onset of symptoms acute.  Nothing makes symptoms better or worse.        Past Medical History:  Diagnosis Date  . Anxiety   . Depression   . Diabetes mellitus without complication (HCC)     There are no problems to display for this patient.   History reviewed. No pertinent surgical history.     Family History  Problem Relation Age of Onset  . Heart disease Mother   . Cancer Father     Social History   Tobacco Use  . Smoking status: Never Smoker  . Smokeless tobacco: Never Used  Substance Use Topics  . Alcohol use: No    Alcohol/week: 0.0 standard drinks  . Drug use: No    Home Medications Prior to Admission medications   Medication Sig Start Date End Date Taking? Authorizing Provider  azithromycin (ZITHROMAX) 250 MG tablet Take 1 tablet (250 mg total) by mouth daily. Take first 2 tablets together, then 1 every day until finished. Patient not taking: Reported on 12/14/2017 07/09/16   Graciella Freer A, PA-C  benzonatate (TESSALON) 100 MG capsule Take 1 capsule (100 mg total) by mouth every 8 (eight) hours. Patient not taking: Reported on 12/14/2017 07/09/16   Graciella Freer A, PA-C  fluticasone (FLONASE) 50 MCG/ACT nasal spray Place 2 sprays into both nostrils daily. Patient not taking: Reported on  12/14/2017 09/14/15   Trena Platt D, PA  loratadine (CLARITIN) 10 MG tablet Take 10 mg by mouth.    [provider]  metFORMIN (GLUCOPHAGE) 500 MG tablet Take 500 mg by mouth. Reported on 10/30/2015 10/04/15 10/03/16  [provider]  metFORMIN (GLUMETZA) 500 MG (MOD) 24 hr tablet Take 500 mg by mouth daily with breakfast.    [provider]    Allergies    Patient has no known allergies.  Review of Systems   Review of Systems  Constitutional: Negative for fever.  HENT: Negative for rhinorrhea and sore throat.   Eyes: Negative for redness.  Respiratory: Negative for cough.   Cardiovascular: Negative for chest pain.  Gastrointestinal: Positive for abdominal pain and nausea. Negative for diarrhea and vomiting.  Genitourinary: Positive for flank pain. Negative for dysuria.  Musculoskeletal: Negative for myalgias.  Skin: Negative for rash.  Neurological: Negative for headaches.    Physical Exam Updated Vital Signs BP (!) 162/108   Pulse (!) 109   Temp 98.4 F (36.9 C) (Oral)   Resp (!) 21   Ht 5' 6.5" (1.689 m)   Wt 131.4 kg   SpO2 98%   BMI 46.04 kg/m   Physical Exam Vitals and nursing note reviewed.  Constitutional:      Appearance: He is well-developed.     Comments: Patient lying in hallway bed, appears comfortable.  HENT:     Head: Normocephalic and atraumatic.  Eyes:     General:        Right eye: No discharge.        Left eye: No discharge.     Conjunctiva/sclera: Conjunctivae normal.  Cardiovascular:     Rate and Rhythm: Normal rate and regular rhythm.     Heart sounds: Normal heart sounds.  Pulmonary:     Effort: Pulmonary effort is normal.     Breath sounds: Normal breath sounds.  Abdominal:     Palpations: Abdomen is soft.     Tenderness: There is no abdominal tenderness. There is right CVA tenderness.     Comments: Abdomen soft and nontender.  Musculoskeletal:     Cervical back: Normal range of motion and neck supple.    Skin:    General: Skin is warm and dry.  Neurological:     Mental Status: He is alert.     ED Results / Procedures / Treatments   Labs (all labs ordered are listed, but only abnormal results are displayed) Labs Reviewed  URINALYSIS, ROUTINE W REFLEX MICROSCOPIC - Abnormal; Notable for the following components:      Result Value   APPearance CLOUDY (*)    Glucose, UA 50 (*)    Hgb urine dipstick LARGE (*)    Ketones, ur 20 (*)    Protein, ur 100 (*)    Leukocytes,Ua LARGE (*)    Bacteria, UA FEW (*)    All other components within normal limits  COMPREHENSIVE METABOLIC PANEL - Abnormal; Notable for the following components:   Glucose, Bld 216 (*)    Total Bilirubin 1.3 (*)    All other components within normal limits  CBC WITH DIFFERENTIAL/PLATELET - Abnormal; Notable for the following components:   WBC 13.0 (*)    Neutro Abs 11.3 (*)    All other components within normal limits  URINE CULTURE  LIPASE, BLOOD    EKG None  Radiology CT Renal Stone Study  Result Date: 04/17/2019 CLINICAL DATA:  Right flank pain. EXAM: CT ABDOMEN AND PELVIS WITHOUT CONTRAST TECHNIQUE: Multidetector CT imaging of the abdomen and pelvis was performed following the standard protocol without IV contrast. COMPARISON:  None. FINDINGS: Lower chest: No acute abnormality. Hepatobiliary: No focal liver abnormality is seen. Diffuse fatty infiltration of the liver is noted. No gallstones, gallbladder wall thickening, or biliary dilatation. Pancreas: Spleen: Adrenals/Urinary Tract: Adrenal glands are unremarkable. Kidneys are normal, without focal lesion. Adjacent 4 mm and 5 mm obstructing renal stones are seen within the proximal right ureter, with mild right-sided hydronephrosis and hydroureter. A 5 mm nonobstructing renal stone is seen within the mid left kidney. Bladder is unremarkable. Stomach/Bowel: There is a small hiatal hernia. Appendix appears normal. No evidence of bowel wall thickening, distention,  or inflammatory changes. Vascular/Lymphatic: No significant vascular findings are present. No enlarged abdominal or pelvic lymph nodes. Reproductive: Prostate is unremarkable. Other: No abdominal wall hernia or abnormality. No abdominopelvic ascites. Musculoskeletal: No acute or significant osseous findings. IMPRESSION: 1. Adjacent 4 mm and 5 mm obstructing renal stones within the proximal right ureter, with mild right-sided hydronephrosis and hydroureter. 2. Nonobstructing 5 mm left renal stone. 3. Hepatic steatosis. 4. Small hiatal hernia. Electronically Signed   By: Aram Candela M.D.   On: 04/17/2019 18:08   US Abdomen Limited RUQ  Result Date: 04/17/2019 CLINICAL DATA:  Right-sided abdominal pain for 2 days EXAM: ULTRASOUND ABDOMEN LIMITED RIGHT UPPER QUADRANT COMPARISON:  CT from  earlier in the same day. FINDINGS: Gallbladder: No gallstones or wall thickening visualized. No sonographic Murphy sign noted by sonographer. Common bile duct: Diameter: 3.2 mm. Liver: Mild increased echogenicity is noted without focal mass. This likely represents fatty infiltration. Portal vein is patent on color Doppler imaging with normal direction of blood flow towards the liver. Other: None. IMPRESSION: Fatty infiltration of the liver.  No acute abnormality noted. Electronically Signed   By: Inez Catalina M.D.   On: 04/17/2019 18:19    Procedures Procedures (including critical care time)  Medications Ordered in ED Medications  ketorolac (TORADOL) 15 MG/ML injection 15 mg (has no administration in time range)  oxyCODONE-acetaminophen (PERCOCET/ROXICET) 5-325 MG per tablet 1 tablet (has no administration in time range)    ED Course  I have reviewed the triage vital signs and the nursing notes.  Pertinent labs & imaging results that were available during my care of the patient were reviewed by me and considered in my medical decision making (see chart for details).  Patient seen and examined.  Reviewed CT and  ultrasound imaging to this point.  Lab work-up shows hyperglycemia without signs of DKA.  UA with hematuria without signs of UTI.  CT demonstrates 2 proximal right-sided ureteral stones.  This is consistent with the patient's story.  Patient has been in the emergency department for greater than 12 hours.  Currently he looks comfortable but complains of pain.  He has not had any treatments to this point.  Toradol/oral Percocet ordered.  Will reassess.  Anticipate discharge home with strainer.  Vital signs reviewed and are as follows: BP (!) 162/108   Pulse (!) 109   Temp 98.4 F (36.9 C) (Oral)   Resp (!) 21   Ht 5' 6.5" (1.689 m)   Wt 131.4 kg   SpO2 98%   BMI 46.04 kg/m   Patient rechecked.  He is feeling much better and pain is controlled.  Plan for home with Percocet, Flomax, and Zofran at this time.  Discussed that we sent a urine culture to evaluate for any concern for UTI.  Clinically patient does not have pyelonephritis today.  Patient counseled on kidney stone treatment. Urged patient to strain urine and save any stones. Urged urology follow-up and return to [facility] with any complications. Counseled patient to maintain good fluid intake.   Counseled patient on use of Flomax.   Patient counseled on use of narcotic pain medications. Counseled not to combine these medications with others containing tylenol. Urged not to drink alcohol, drive, or perform any other activities that requires focus while taking these medications. The patient verbalizes understanding and agrees with the plan.     MDM Rules/Calculators/A&P                      Patient presents with waxing and waning right-sided flank pain.  Patient found to have right-sided ureteral stones and intrarenal stone on the left.  Patient also had a reassuring right upper quadrant ultrasound.  Patient symptoms are controlled in the emergency department he has been afebrile.  Mild tachycardia, suspect due to pain and an element of  dehydration.  He is not vomiting.  I have low concern for pyelonephritis.  He does have some white cells in his urine --for this reason a urine culture was sent.  He appears well and symptoms are controlled enough for outpatient trial.  He is given a strainer and urology follow-up.  Return instructions as above.   Final  Clinical Impression(s) / ED Diagnoses Final diagnoses:  RUQ abdominal tenderness  Ureteral colic    Rx / DC Orders ED Discharge Orders    None       Renne Crigler, PA-C 04/18/19 0820    Devoria Albe, MD 04/21/19 [phone]

## 2019-04-19 LAB — URINE CULTURE: Culture: <10000 — AB

## 2019-05-08 ENCOUNTER — Ambulatory Visit: Payer: Self-pay | Attending: Internal Medicine

## 2019-05-08 DIAGNOSIS — Z20822 Contact with and (suspected) exposure to covid-19: Secondary | ICD-10-CM | POA: Insufficient documentation

## 2019-05-09 LAB — NOVEL CORONAVIRUS, NAA: SARS-CoV-2, NAA: NOT DETECTED

## 2019-10-23 ENCOUNTER — Other Ambulatory Visit: Payer: Self-pay

## 2019-10-23 ENCOUNTER — Encounter (HOSPITAL_COMMUNITY): Payer: Self-pay

## 2019-10-23 ENCOUNTER — Emergency Department (HOSPITAL_COMMUNITY)
Admission: EM | Admit: 2019-10-23 | Discharge: 2019-10-23 | Disposition: A | Discharge status: Home / Self Care | Payer: Self-pay | Attending: Emergency Medicine | Admitting: Emergency Medicine

## 2019-10-23 DIAGNOSIS — E119 Type 2 diabetes mellitus without complications: Secondary | ICD-10-CM | POA: Insufficient documentation

## 2019-10-23 DIAGNOSIS — R109 Unspecified abdominal pain: Secondary | ICD-10-CM | POA: Insufficient documentation

## 2019-10-23 DIAGNOSIS — Z7984 Long term (current) use of oral hypoglycemic drugs: Secondary | ICD-10-CM | POA: Insufficient documentation

## 2019-10-23 DIAGNOSIS — Z87442 Personal history of urinary calculi: Secondary | ICD-10-CM | POA: Insufficient documentation

## 2019-10-23 HISTORY — DX: Disorder of kidney and ureter, unspecified: N28.9

## 2019-10-23 LAB — CBC WITH DIFFERENTIAL/PLATELET
Abs Immature Granulocytes: 0.02 10*3/uL (ref 0.00–0.07)
Basophils Absolute: 0 10*3/uL (ref 0.0–0.1)
Basophils Relative: 0 %
Eosinophils Absolute: 0.1 10*3/uL (ref 0.0–0.5)
Eosinophils Relative: 1 %
HCT: 41.9 % (ref 39.0–52.0)
Hemoglobin: 14.5 g/dL (ref 13.0–17.0)
Immature Granulocytes: 0 %
Lymphocytes Relative: 15 %
Lymphs Abs: 1.2 10*3/uL (ref 0.7–4.0)
MCH: 28.5 pg (ref 26.0–34.0)
MCHC: 34.6 g/dL (ref 30.0–36.0)
MCV: 82.5 fL (ref 80.0–100.0)
Monocytes Absolute: 0.5 10*3/uL (ref 0.1–1.0)
Monocytes Relative: 6 %
Neutro Abs: 5.9 10*3/uL (ref 1.7–7.7)
Neutrophils Relative %: 78 %
Platelets: 191 10*3/uL (ref 150–400)
RBC: 5.08 MIL/uL (ref 4.22–5.81)
RDW: 12.5 % (ref 11.5–15.5)
WBC: 7.8 10*3/uL (ref 4.0–10.5)
nRBC: 0 % (ref 0.0–0.2)

## 2019-10-23 LAB — BASIC METABOLIC PANEL
Anion gap: 13 (ref 5–15)
BUN: 13 mg/dL (ref 6–20)
CO2: 24 mmol/L (ref 22–32)
Calcium: 9.2 mg/dL (ref 8.9–10.3)
Chloride: 98 mmol/L (ref 98–111)
Creatinine, Ser: 0.81 mg/dL (ref 0.61–1.24)
GFR calc Af Amer: >60 mL/min (ref >60)
GFR calc non Af Amer: >60 mL/min (ref >60)
Glucose, Bld: 220 mg/dL — ABNORMAL HIGH (ref 70–99)
Potassium: 3.9 mmol/L (ref 3.5–5.1)
Sodium: 135 mmol/L (ref 135–145)

## 2019-10-23 LAB — URINALYSIS, ROUTINE W REFLEX MICROSCOPIC
Bilirubin Urine: NEGATIVE
Glucose, UA: 50 mg/dL — AB
Ketones, ur: NEGATIVE mg/dL
Nitrite: NEGATIVE
Protein, ur: NEGATIVE mg/dL
Specific Gravity, Urine: 1.021 (ref 1.005–1.030)
pH: 5 (ref 5.0–8.0)

## 2019-10-23 MED ORDER — TAMSULOSIN HCL 0.4 MG PO CAPS: 0.4000 mg | ORAL_CAPSULE | Freq: Every day | ORAL | 0 refills | Status: DC

## 2019-10-23 MED ORDER — ONDANSETRON 4 MG PO TBDP: 4.0000 mg | ORAL_TABLET | Freq: Three times a day (TID) | ORAL | 0 refills | Status: DC | PRN

## 2019-10-23 MED ORDER — ONDANSETRON 4 MG PO TBDP
4.0000 mg | ORAL_TABLET | Freq: Once | ORAL | Status: DC | PRN
  Filled 2019-10-23: qty 1

## 2019-10-23 NOTE — ED Triage Notes (Signed)
Patient states he has a known kidney stone. Patient c/o right lower back pain that radiates into the right groin since 0500 today. Patient also c/o nausea.

## 2019-10-23 NOTE — Discharge Instructions (Signed)
Please take ibuprofen every 6 hours to help with any pain. You can take Zofran every 8 hours for nausea. Please follow-up with urology. Return to the emergency department if you have high fever, uncontrollable pain, or uncontrollable vomiting.

## 2019-10-23 NOTE — ED Provider Notes (Signed)
[facility] Provider Note   CSN: [id number] Arrival date & time: 10/23/19  1200     History Chief Complaint  Patient presents with  . Back Pain    Christopher Moore is a 39 y.o. male. w past medical history of nephrolithiasis, type 2 diabetes, presenting the emergency department with complaint of right flank pain that began early this morning.  Pain is right lower back radiating to right groin and testicle.  Pain is coming and going.  He reports associated urinary urgency.  It feels very similar to previous kidney stones.  He reports associated nausea without vomiting.  He is currently not having any pain or nausea on evaluation.  Denies fevers, dysuria, hematuria, testicular swelling, scrotal swelling.  He has not treated his symptoms with any medications.  Of note, patient reports he has not been compliant with his Metformin for a few weeks.  The history is provided by the patient.       Past Medical History:  Diagnosis Date  . Anxiety   . Depression   . Diabetes mellitus without complication (HCC)   . Renal disorder    kidney stone    There are no problems to display for this patient.   History reviewed. No pertinent surgical history.     Family History  Problem Relation Age of Onset  . Heart disease Mother   . Cancer Father     Social History   Tobacco Use  . Smoking status: Never Smoker  . Smokeless tobacco: Never Used  Vaping Use  . Vaping Use: Never used  Substance Use Topics  . Alcohol use: No    Alcohol/week: 0.0 standard drinks  . Drug use: No    Home Medications Prior to Admission medications   Medication Sig Start Date End Date Taking? Authorizing Provider  loratadine (CLARITIN) 10 MG tablet Take 10 mg by mouth.    [provider]  metFORMIN (GLUCOPHAGE) 500 MG tablet Take 500 mg by mouth. Reported on 10/30/2015 10/04/15 10/03/16  [provider]  metFORMIN (GLUMETZA) 500 MG (MOD) 24 hr  tablet Take 500 mg by mouth daily with breakfast.    [provider]  ondansetron (ZOFRAN ODT) 4 MG disintegrating tablet Take 1 tablet (4 mg total) by mouth every 8 (eight) hours as needed for nausea or vomiting. 10/23/19   Nijee Heatwole, [place] N, PA-C  oxyCODONE-acetaminophen (PERCOCET/ROXICET) 5-325 MG tablet Take 1-2 tablets by mouth every 6 (six) hours as needed for severe pain. 04/18/19   Renne Crigler, PA-C  tamsulosin (FLOMAX) 0.4 MG CAPS capsule Take 1 capsule (0.4 mg total) by mouth daily. 10/23/19   Neomia Herbel, [place] N, PA-C  fluticasone (FLONASE) 50 MCG/ACT nasal spray Place 2 sprays into both nostrils daily. Patient not taking: Reported on 12/14/2017 09/14/15 04/18/19  Trena Platt D, PA    Allergies    Patient has no known allergies.  Review of Systems   Review of Systems  All other systems reviewed and are negative.   Physical Exam Updated Vital Signs BP (!) 141/98   Pulse 79   Temp 98.5 F (36.9 C) (Oral)   Resp 16   Ht 5\' 6"  (1.676 m)   Wt 132 kg   SpO2 95%   BMI 46.95 kg/m   Physical Exam Vitals and nursing note reviewed.  Constitutional:      General: He is not in acute distress.    Appearance: He is well-developed. He is obese.  HENT:  Head: Normocephalic and atraumatic.  Eyes:     Conjunctiva/sclera: Conjunctivae normal.  Cardiovascular:     Rate and Rhythm: Normal rate and regular rhythm.  Pulmonary:     Effort: Pulmonary effort is normal. No respiratory distress.     Breath sounds: Normal breath sounds.  Abdominal:     General: Bowel sounds are normal.     Palpations: Abdomen is soft.     Tenderness: There is no abdominal tenderness. There is right CVA tenderness. There is no guarding or rebound.  Skin:    General: Skin is warm.  Neurological:     Mental Status: He is alert.  Psychiatric:        Behavior: Behavior normal.     ED Results / Procedures / Treatments   Labs (all labs ordered are listed, but only abnormal results are  displayed) Labs Reviewed  URINALYSIS, ROUTINE W REFLEX MICROSCOPIC - Abnormal; Notable for the following components:      Result Value   Glucose, UA 50 (*)    Hgb urine dipstick LARGE (*)    Leukocytes,Ua TRACE (*)    Bacteria, UA RARE (*)    All other components within normal limits  BASIC METABOLIC PANEL - Abnormal; Notable for the following components:   Glucose, Bld 220 (*)    All other components within normal limits  URINE CULTURE  CBC WITH DIFFERENTIAL/PLATELET    EKG None  Radiology No results found.  Procedures Procedures (including critical care time)  Medications Ordered in ED Medications  ondansetron (ZOFRAN-ODT) disintegrating tablet 4 mg (has no administration in time range)    ED Course  I have reviewed the triage vital signs and the nursing notes.  Pertinent labs & imaging results that were available during my care of the patient were reviewed by me and considered in my medical decision making (see chart for details).    MDM Rules/Calculators/A&P                          Patient with history of nephrolithiasis, presenting with colicky right flank pain rating to his groin began this morning.  He had some associated nausea and urinary urgency.  On evaluation his symptoms are nearly resolved and he is well-appearing.  Abdomen is benign on exam.  He is afebrile.  Labs are reassuring, normal renal function.  No leukocytosis.  UA with large blood, rare bacteria.  Culture sent.  At this time do not believe repeat CT scan is necessary.  Will treat as suspected stone with pain medication, antiemetics as needed.  Urology referral provided for follow-up.  Return precautions discussed.  Patient is agreeable with plan, appropriate for discharge.  Final Clinical Impression(s) / ED Diagnoses Final diagnoses:  Right flank pain  History of nephrolithiasis    Rx / DC Orders ED Discharge Orders         Ordered    ondansetron (ZOFRAN ODT) 4 MG disintegrating tablet   Every 8 hours PRN     Discontinue  Reprint     10/23/19 1738    tamsulosin (FLOMAX) 0.4 MG CAPS capsule  Daily     Discontinue  Reprint     10/23/19 1738           Everlena Mackley, [place] N, PA-C 10/23/19 Mallie Snooks    Linwood Dibbles, MD 10/24/19 1128

## 2019-10-25 LAB — URINE CULTURE: Culture: NO GROWTH

## 2020-08-25 IMAGING — CT CT RENAL STONE PROTOCOL
2 of 4 series · 16 of 46 positions shown, 18 images · non-contrast
Comparison: None.

CLINICAL DATA: Right flank pain.

EXAM:
CT ABDOMEN AND PELVIS WITHOUT CONTRAST
TECHNIQUE: Multidetector CT imaging of the abdomen and pelvis was performed
following the standard protocol without IV contrast.

[Series 2: axial st · axial · 0.91mm/px · z∈[+1226,+1671]mm · 13 of 103 slices shown, 15 images]
[im 7/103  soft-tissue]
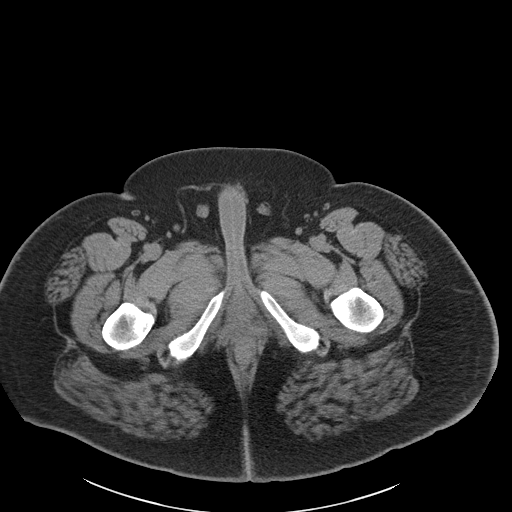
[im 7/103  bone]
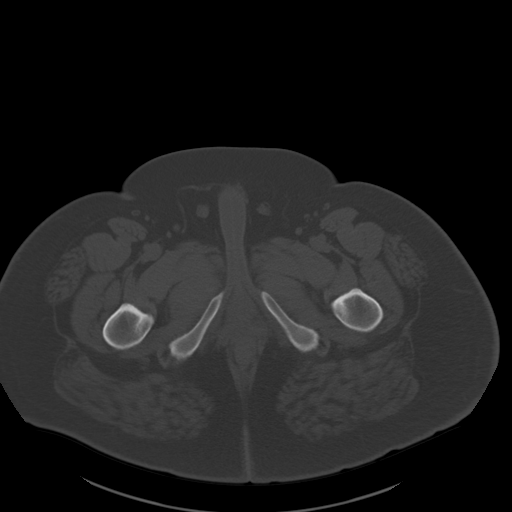
[im 13/103  soft-tissue]
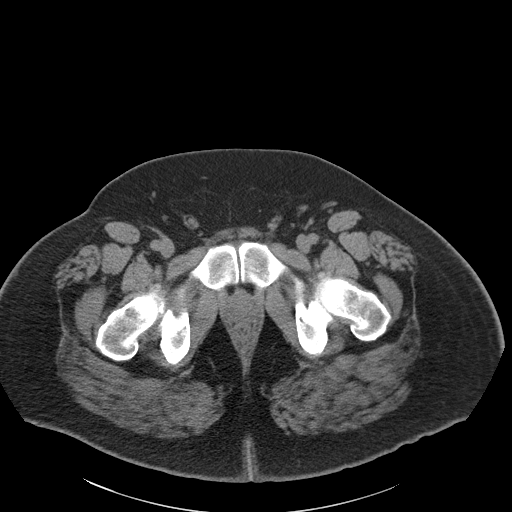
[im 20/103  soft-tissue]
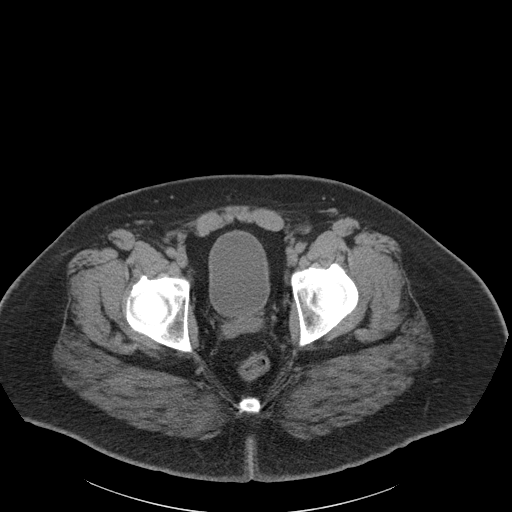
[im 32/103  soft-tissue]
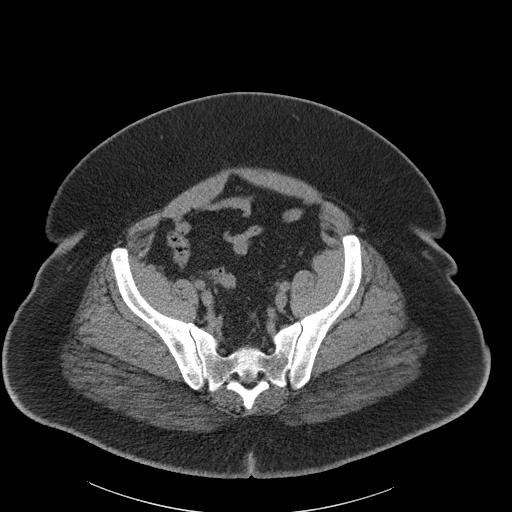
[im 39/103  soft-tissue]
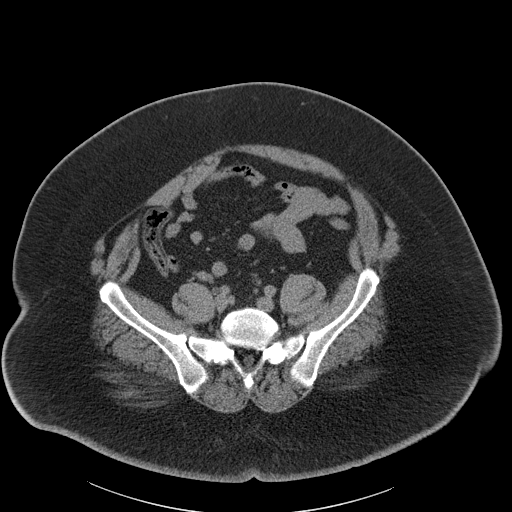
[im 45/103  soft-tissue]
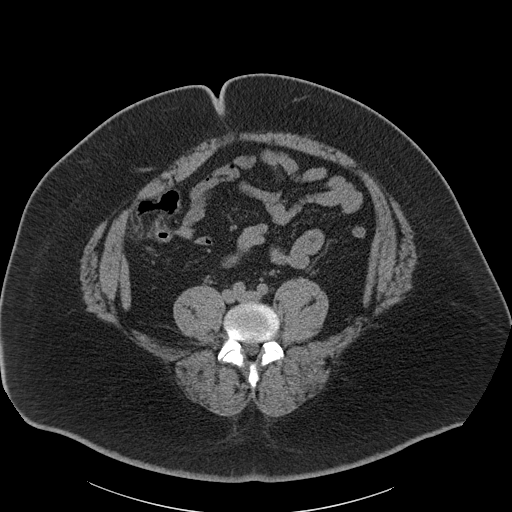
[im 52/103  soft-tissue]
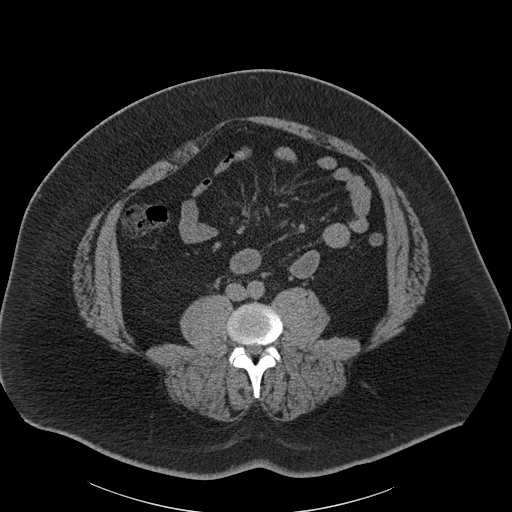
[im 58/103  soft-tissue]
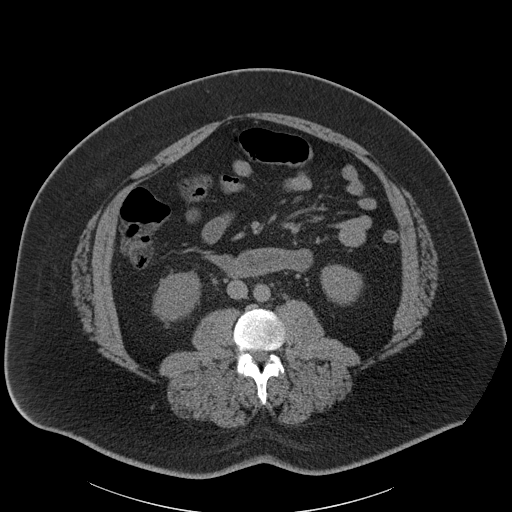
[im 64/103  soft-tissue]
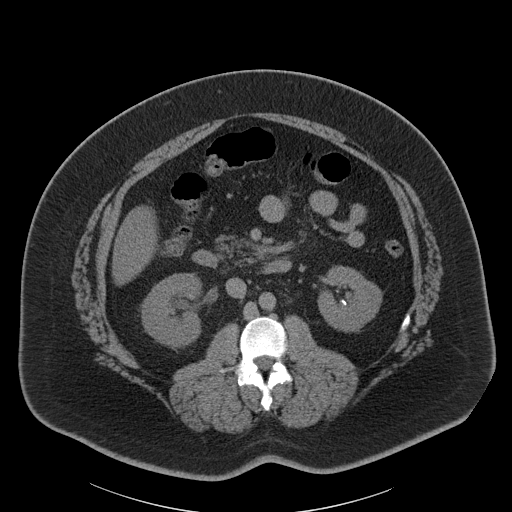
[im 64/103  bone]
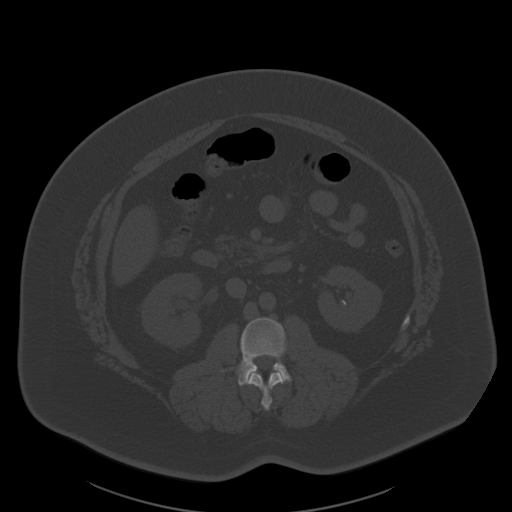
[im 71/103  soft-tissue]
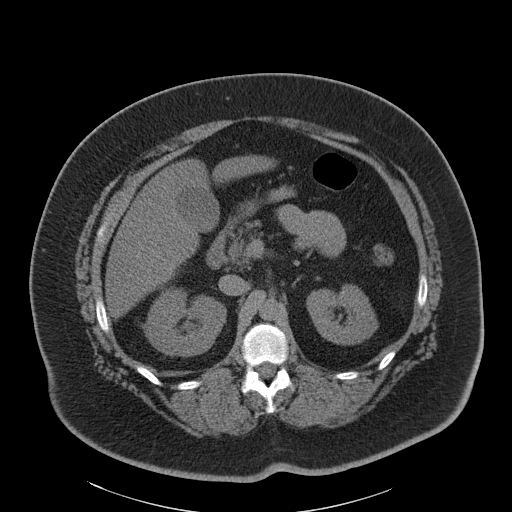
[im 83/103  soft-tissue]
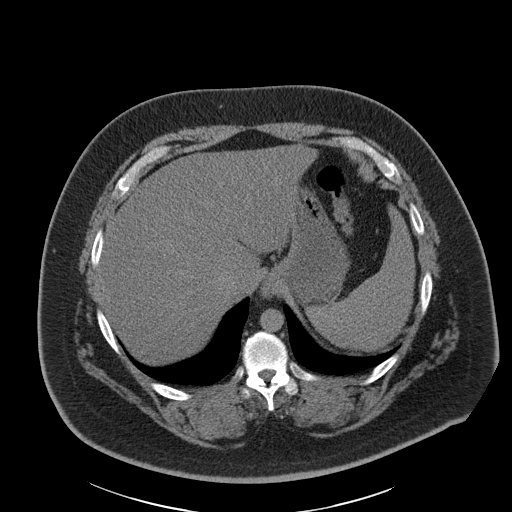
[im 90/103  soft-tissue]
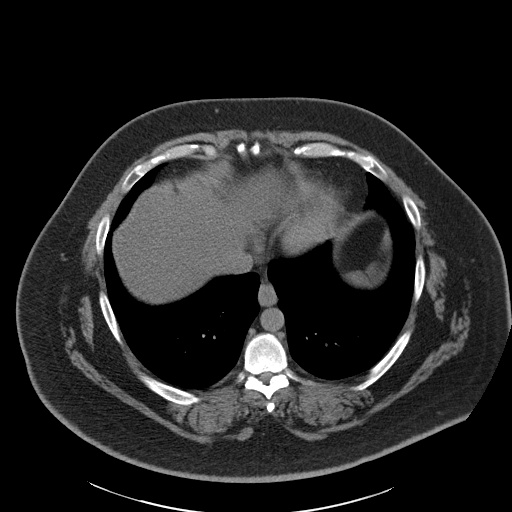
[im 96/103  soft-tissue]
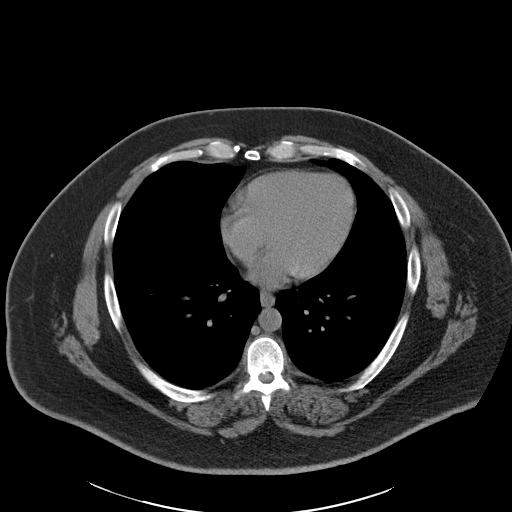

[Series 5: coronal · coronal · 0.86mm/px · 3 of 183 slices shown]
[im 61/183  soft-tissue]
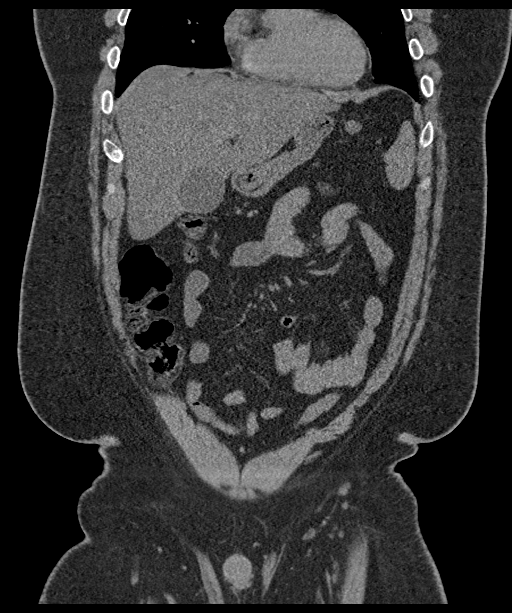
[im 81/183  soft-tissue]
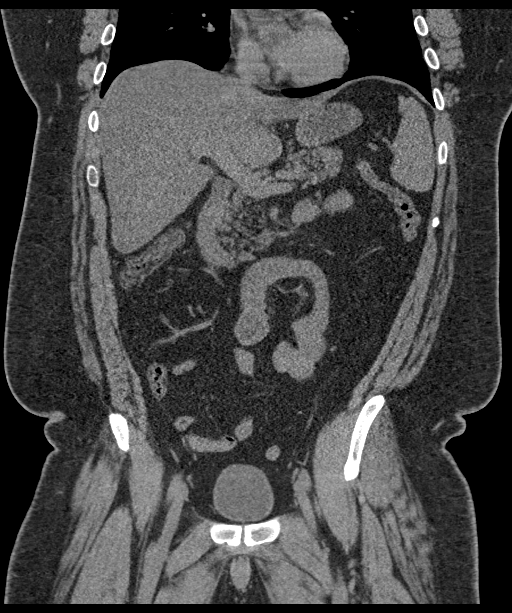
[im 102/183  soft-tissue]
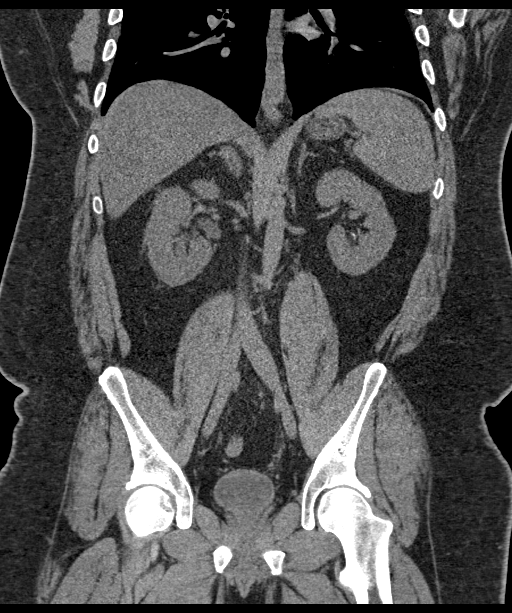

[16 of 46 positions shown; findings below may reference images not displayed]

FINDINGS: Lower chest: No acute abnormality.

Hepatobiliary: No focal liver abnormality is seen. Diffuse fatty
infiltration of the liver is noted. No gallstones, gallbladder wall
thickening, or biliary dilatation.

Pancreas:

Spleen:

Adrenals/Urinary Tract: Adrenal glands are unremarkable. Kidneys are
normal, without focal lesion. Adjacent 4 mm and 5 mm obstructing
renal stones are seen within the proximal right ureter, with mild
right-sided hydronephrosis and hydroureter. A 5 mm nonobstructing
renal stone is seen within the mid left kidney. Bladder is
unremarkable.

Stomach/Bowel: There is a small hiatal hernia. Appendix appears
normal. No evidence of bowel wall thickening, distention, or
inflammatory changes.

Vascular/Lymphatic: No significant vascular findings are present. No
enlarged abdominal or pelvic lymph nodes.

Reproductive: Prostate is unremarkable.

Other: No abdominal wall hernia or abnormality. No abdominopelvic
ascites.

Musculoskeletal: No acute or significant osseous findings.
IMPRESSION: 1. Adjacent 4 mm and 5 mm obstructing renal stones within the
proximal right ureter, with mild right-sided hydronephrosis and
hydroureter.
2. Nonobstructing 5 mm left renal stone.
3. Hepatic steatosis.
4. Small hiatal hernia.

## 2020-08-25 IMAGING — US US ABDOMEN LIMITED
1 series · 14 of 25 positions shown · non-contrast
Comparison: CT from earlier in the same day.

CLINICAL DATA: Right-sided abdominal pain for 2 days

EXAM:
ULTRASOUND ABDOMEN LIMITED RIGHT UPPER QUADRANT

[Series 1: us abdomen limited · 14 of 58 slices shown]
[im 1/58]
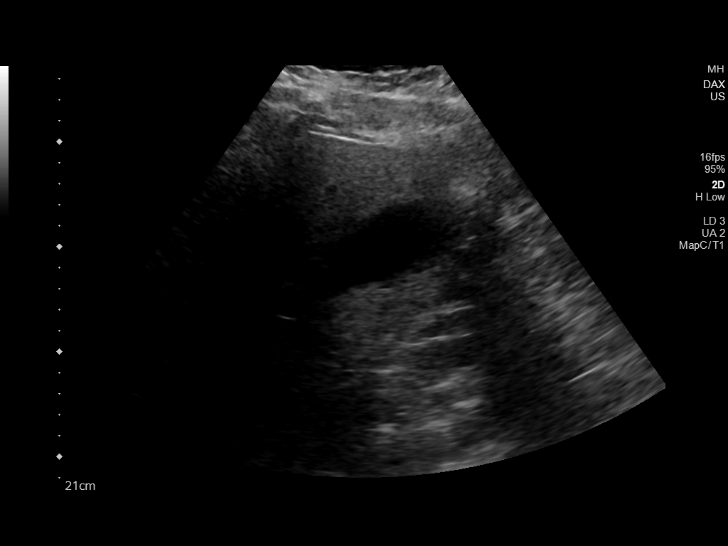
[im 5/58]
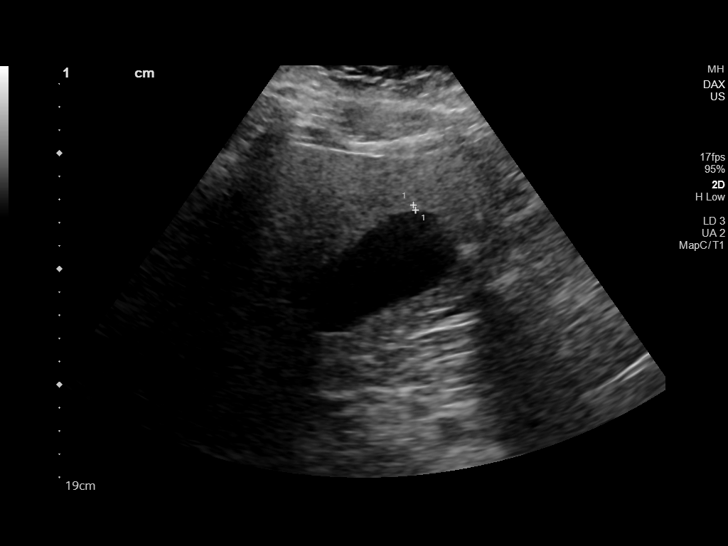
[im 10/58]
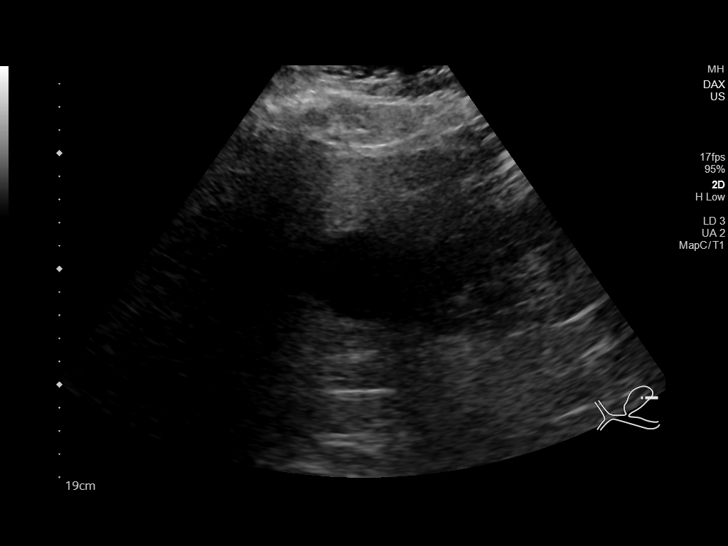
[im 15/58]
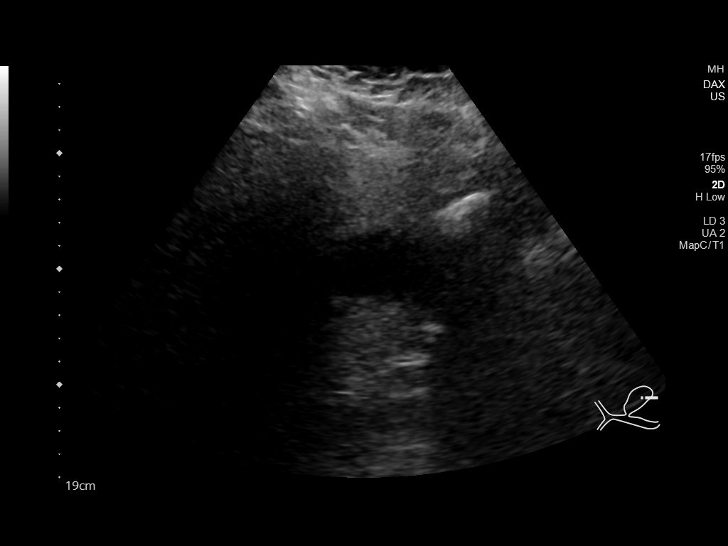
[im 20/58]
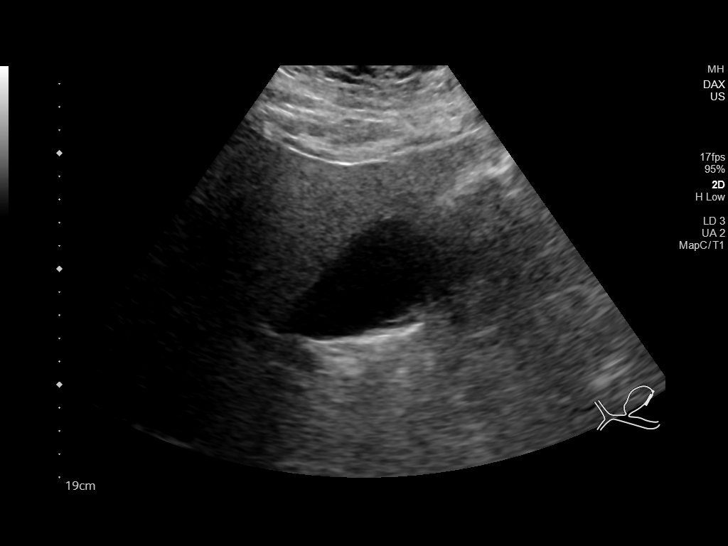
[im 22/58]
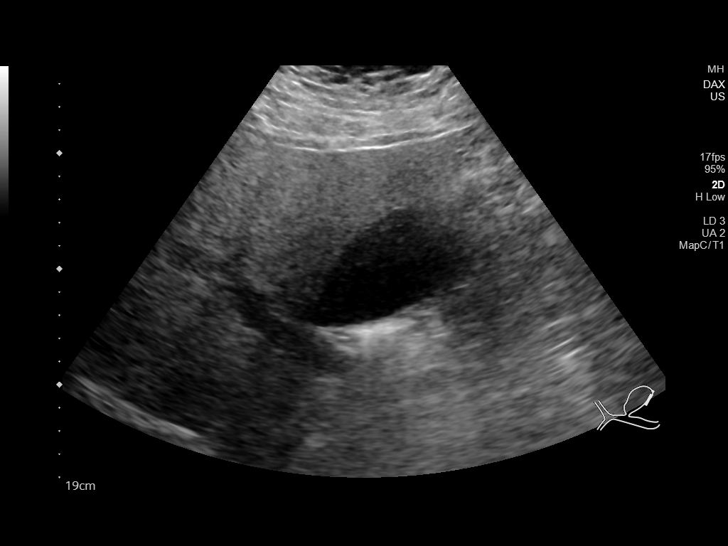
[im 27/58]
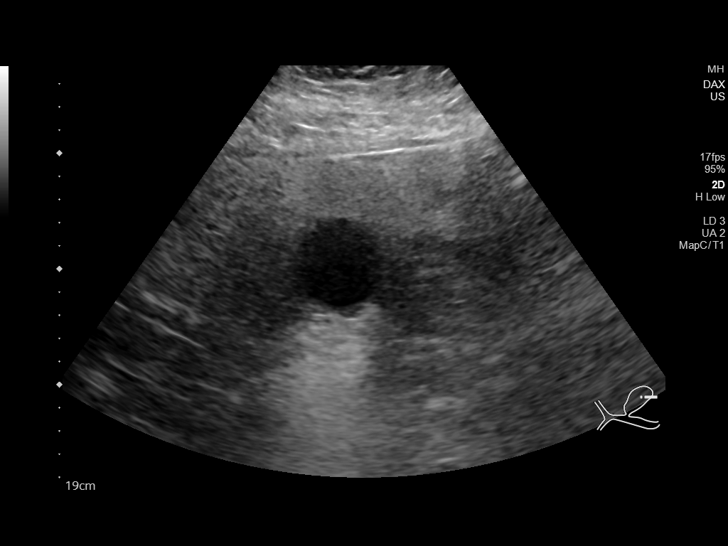
[im 31/58]
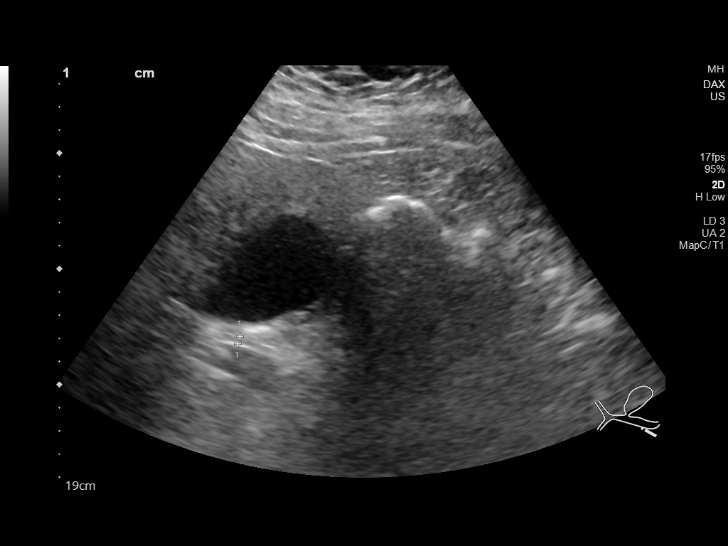
[im 36/58]
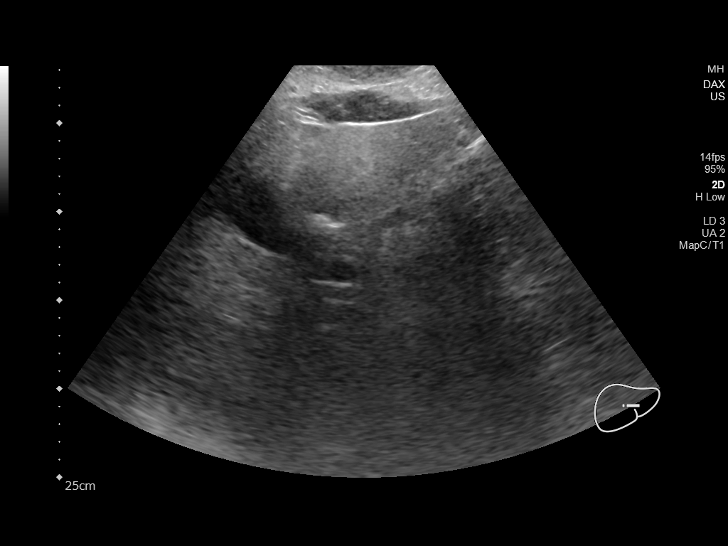
[im 39/58]
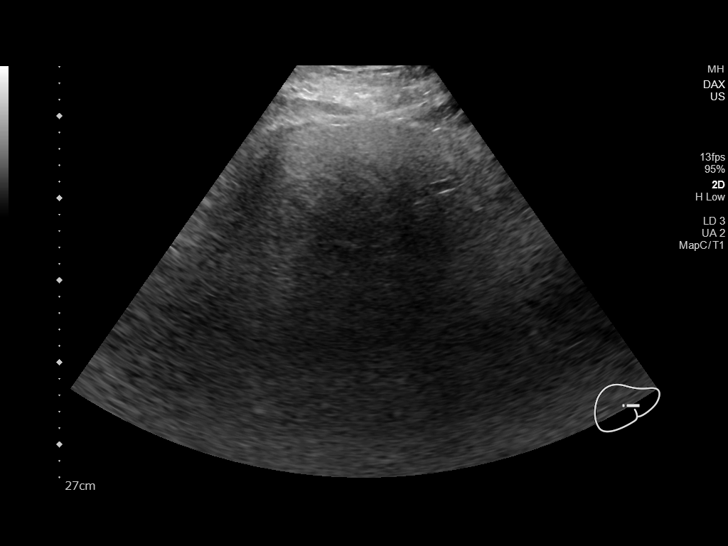
[im 43/58]
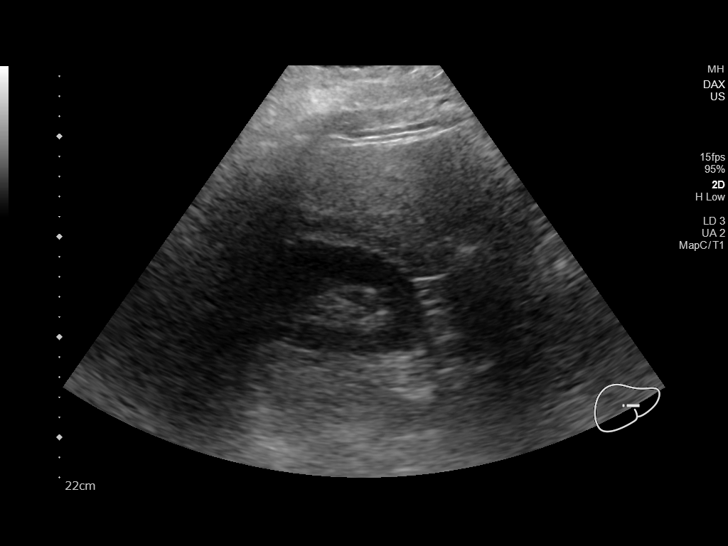
[im 48/58]
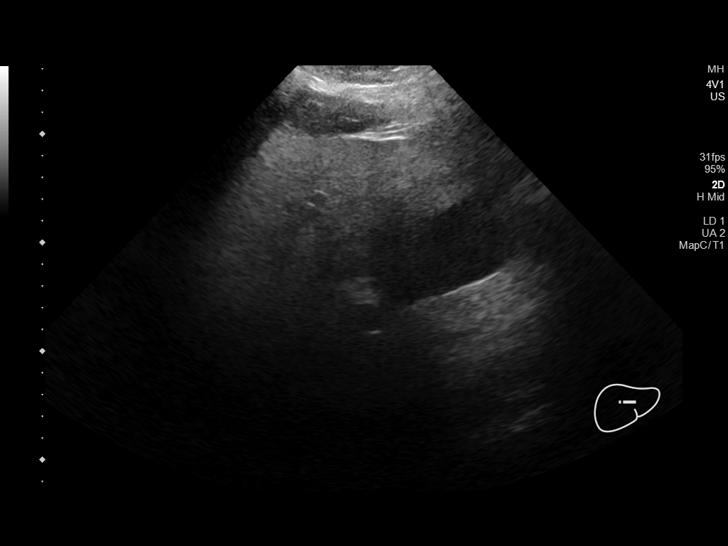
[im 53/58]
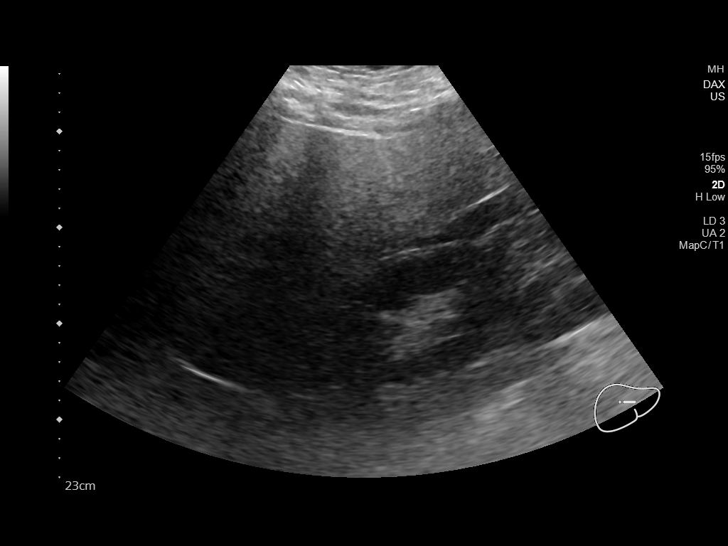
[im 58/58]
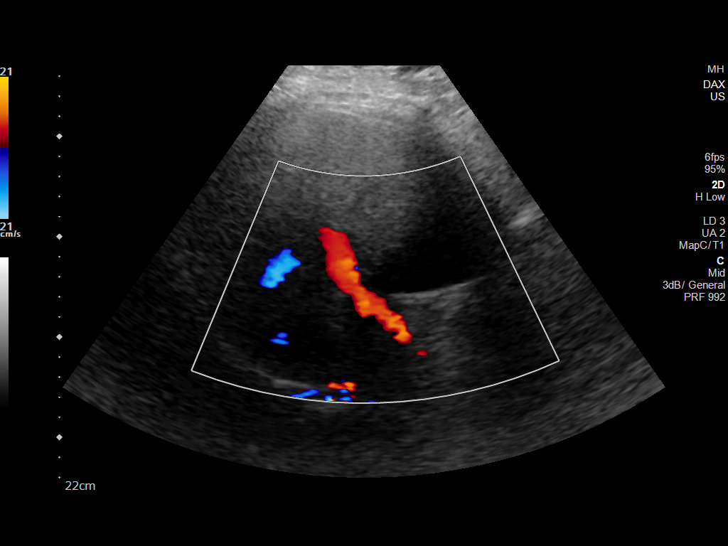

[14 of 25 positions shown; findings below may reference images not displayed]

FINDINGS: Gallbladder:

No gallstones or wall thickening visualized. No sonographic Murphy
sign noted by sonographer.

Common bile duct:

Diameter: 3.2 mm.

Liver:

Mild increased echogenicity is noted without focal mass. This likely
represents fatty infiltration. Portal vein is patent on color
Doppler imaging with normal direction of blood flow towards the
liver.

Other: None.
IMPRESSION: Fatty infiltration of the liver.  No acute abnormality noted.

## 2021-04-23 ENCOUNTER — Ambulatory Visit (HOSPITAL_COMMUNITY)
Admission: EM | Admit: 2021-04-23 | Discharge: 2021-04-23 | Disposition: A | Discharge status: Home / Self Care | Payer: No Payment, Other | Attending: Registered Nurse | Admitting: Registered Nurse

## 2021-04-23 ENCOUNTER — Encounter (HOSPITAL_COMMUNITY): Payer: Self-pay | Admitting: Registered Nurse

## 2021-04-23 DIAGNOSIS — F4323 Adjustment disorder with mixed anxiety and depressed mood: Secondary | ICD-10-CM | POA: Diagnosis present

## 2021-04-23 DIAGNOSIS — F331 Major depressive disorder, recurrent, moderate: Secondary | ICD-10-CM | POA: Diagnosis present

## 2021-04-23 NOTE — ED Provider Notes (Signed)
[facility]al [facility] Urgent Care Medical Screening Exam  Patient Name: Christopher Moore MRN: [id number] Date of Evaluation: 04/23/21 Chief Complaint:   Diagnosis:  Final diagnoses:  Adjustment disorder with mixed anxiety and depressed mood  MDD (major depressive disorder), recurrent episode, moderate ([place])    History of Present illness: Christopher Moore is a 41 y.o. male patient presented to [facility] as a walk in with complaints of worsening depression, anxiety, and seeking outpatient psychiatric services.  Christopher Moore, 41 y.o., male patient seen face to face by this provider, consulted with Dr. Ernie Hew; and chart reviewed on 04/23/21.  On evaluation Christopher Moore reports he came in today to see outpatient psychiatric services.  Patient reporting that he has had a lot of stressors and situations throughout his life that have seemed to worsen over the last 2 years since the death of his mother.  Patient reporting situations that started in his childhood, worsened after the death of his father, and continued to worsen after the death of his mother in 2019-07-29.  Patient denies any psychiatric history of within being treated for depression and anxiety.  Reports prior history of outpatient psychiatric services for medication management and therapy for grief.  Patient denies suicidal/self-harm/homicidal ideations, psychosis, paranoia.  Patient reported he lives with his wife and he and his wife have rented out rooms in their home to various people to which he has also had conflict or situations that worsens his depression and anxiety.  Reports he is employed. During evaluation Christopher Moore is sitting upright in chair in no acute distress.  He is alert/oriented x 4; calm/cooperative; and mood congruent with affect.  He is speaking in a clear tone at moderate volume, and normal pace; with good eye contact.  His thought process is coherent and relevant; There is no indication that he is  currently responding to internal/external stimuli or experiencing delusional thought content; and he has denied suicidal/self-harm/homicidal ideation, psychosis, and paranoia.   Patient has remained calm throughout assessment and has answered questions appropriately.    At this time Christopher Moore is educated and verbalizes understanding of mental health resources and other crisis services in the community. He is instructed to call 911 and present to the nearest emergency room should he experience any suicidal/homicidal ideation, auditory/visual/hallucinations, or detrimental worsening of his mental health condition.  Resources given for outpatient psychiatric services    Psychiatric Specialty Exam  Presentation  General Appearance:Appropriate for Environment  Eye Contact:Good  Speech:Clear and Coherent; Normal Rate  Speech Volume:Normal  Handedness:Right   Mood and Affect  Mood:Depressed  Affect:Congruent   Thought Process  Thought Processes:Coherent; Goal Directed  Descriptions of Associations:Intact  Orientation:Full (Time, Place and Person)  Thought Content:Logical; WDL    Hallucinations:None  Ideas of Reference:None  Suicidal Thoughts:No  Homicidal Thoughts:No   Sensorium  Memory:Immediate Good; Recent Good; Remote Good  Judgment:Intact  Insight:Present   Executive Functions  Concentration:Good  Attention Span:Good  [place]cello  Language:Good   Psychomotor Activity  Psychomotor Activity:Normal   Assets  Assets:Communication Skills; Desire for Improvement; Housing; Social Support   Sleep  Sleep:Good  Number of hours: No data recorded  Nutritional Assessment (For OBS and FBC admissions only) Has the patient had a weight loss or gain of 10 pounds or more in the last 3 months?: No Has the patient had a decrease in food intake/or appetite?: No Does the patient have dental problems?: No Does the patient have  eating  habits or behaviors that may be indicators of an eating disorder including binging or inducing vomiting?: No Has the patient recently lost weight without trying?: 0 Has the patient been eating poorly because of a decreased appetite?: 0 Malnutrition Screening Tool Score: 0    Physical Exam: Physical Exam Vitals and nursing note reviewed. Exam conducted with a chaperone present.  Constitutional:      General: He is not in acute distress.    Appearance: Normal appearance. He is not ill-appearing.  Cardiovascular:     Rate and Rhythm: Normal rate.  Pulmonary:     Effort: Pulmonary effort is normal.  Musculoskeletal:        General: Normal range of motion.     Cervical back: Normal range of motion.  Skin:    General: Skin is warm and dry.  Neurological:     Mental Status: He is alert and oriented to person, place, and time.  Psychiatric:        Attention and Perception: Attention and perception normal. He does not perceive auditory or visual hallucinations.        Mood and Affect: Affect normal. Mood is depressed.        Speech: Speech normal.        Behavior: Behavior normal.        Thought Content: Thought content normal. Thought content is not paranoid or delusional. Thought content does not include homicidal or suicidal ideation.        Cognition and Memory: Cognition and memory normal.        Judgment: Judgment normal.   Review of Systems  Constitutional: Negative.   HENT: Negative.    Eyes: Negative.   Respiratory: Negative.    Cardiovascular: Negative.   Gastrointestinal: Negative.   Genitourinary: Negative.   Musculoskeletal: Negative.   Skin: Negative.   Neurological: Negative.   Endo/Heme/Allergies: Negative.   Psychiatric/Behavioral:  Depression: Stable. Hallucinations: Denies. Substance abuse: Denies. Suicidal ideas: Denies. Nervous/anxious: Stable.        States he is wanting outpatient psychiatric services for medication management and therapy to help  deal with issues that started during his childhood and have worsened over the last 2 years.    Blood pressure (!) 147/99, pulse 80, temperature (!) 97.4 F (36.3 C), temperature source Oral, resp. rate 18, SpO2 100 %. There is no height or weight on file to calculate BMI.  Musculoskeletal: Strength & Muscle Tone: within normal limits Gait & Station: normal Patient leans: N/A   [facility] Discharge Disposition for Follow up and Recommendations: Based on my evaluation the patient does not appear to have an emergency medical condition and can be discharged with resources and follow up care in outpatient services for Medication Management, Individual Therapy, and [place].   Specialty: Urgent Care Why: New Therapy walkin:  Monday-Wednesday from 7:30am-12:30pm.  New Medication management walkin Monday-Friday from 7:30 am to 11:00am.  Patient will be taken in the order that they come.  You may not be seen on the same day as walkin. first come first Midwife information: [place]        Call  [place].   Specialty: Medical illustrator Why: schedule an appointment for medication management and therapy Contact information: [employer] [facility] the [place] [address] [phone]         [facility]everly Sessions.  Why: Walk in hours Monday thru Thursday 8:00 AM to 3:00 PM first come first serve Contact information: [address]  [address] [place] [address] [phone]                   Earleen Newport, NP 04/23/2021, 9:54 AM

## 2021-04-23 NOTE — ED Notes (Signed)
Patient discharge with community resources, and follow up instructions. Patient denied SI HI and AVH at time of discharge.
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
## 2021-05-05 ENCOUNTER — Telehealth (HOSPITAL_COMMUNITY): Payer: Self-pay | Admitting: Emergency Medicine

## 2021-05-05 NOTE — BH Assessment (Addendum)
Care Management - [facility] Follow Up Discharges   Writer attempted to make contact with patient today and was unsuccessful.  No voicemail, phone just rang.    Per chart review, patient was provided with outpatient resources.

## 2022-01-11 ENCOUNTER — Emergency Department (HOSPITAL_COMMUNITY): Payer: Self-pay

## 2022-01-11 ENCOUNTER — Encounter (HOSPITAL_COMMUNITY): Payer: Self-pay | Admitting: Emergency Medicine

## 2022-01-11 ENCOUNTER — Emergency Department (HOSPITAL_COMMUNITY)
Admission: EM | Admit: 2022-01-11 | Discharge: 2022-01-12 | Disposition: A | Discharge status: Home / Self Care | Payer: Self-pay | Attending: Emergency Medicine | Admitting: Emergency Medicine

## 2022-01-11 DIAGNOSIS — E119 Type 2 diabetes mellitus without complications: Secondary | ICD-10-CM | POA: Insufficient documentation

## 2022-01-11 DIAGNOSIS — N200 Calculus of kidney: Secondary | ICD-10-CM | POA: Insufficient documentation

## 2022-01-11 DIAGNOSIS — Z7984 Long term (current) use of oral hypoglycemic drugs: Secondary | ICD-10-CM | POA: Insufficient documentation

## 2022-01-11 LAB — URINALYSIS, ROUTINE W REFLEX MICROSCOPIC
Bacteria, UA: NONE SEEN
Bilirubin Urine: NEGATIVE
Glucose, UA: 150 mg/dL — AB
Ketones, ur: NEGATIVE mg/dL
Leukocytes,Ua: NEGATIVE
Nitrite: NEGATIVE
Protein, ur: NEGATIVE mg/dL
RBC / HPF: >50 RBC/hpf — ABNORMAL HIGH (ref 0–5)
Specific Gravity, Urine: 1.018 (ref 1.005–1.030)
pH: 6 (ref 5.0–8.0)

## 2022-01-11 LAB — CBC
HCT: 44.9 % (ref 39.0–52.0)
Hemoglobin: 15.6 g/dL (ref 13.0–17.0)
MCH: 28.8 pg (ref 26.0–34.0)
MCHC: 34.7 g/dL (ref 30.0–36.0)
MCV: 83 fL (ref 80.0–100.0)
Platelets: 207 10*3/uL (ref 150–400)
RBC: 5.41 MIL/uL (ref 4.22–5.81)
RDW: 12.3 % (ref 11.5–15.5)
WBC: 9.8 10*3/uL (ref 4.0–10.5)
nRBC: 0 % (ref 0.0–0.2)

## 2022-01-11 LAB — BASIC METABOLIC PANEL
Anion gap: 8 (ref 5–15)
BUN: 12 mg/dL (ref 6–20)
CO2: 23 mmol/L (ref 22–32)
Calcium: 8.9 mg/dL (ref 8.9–10.3)
Chloride: 104 mmol/L (ref 98–111)
Creatinine, Ser: 0.86 mg/dL (ref 0.61–1.24)
GFR, Estimated: >60 mL/min (ref >60)
Glucose, Bld: 198 mg/dL — ABNORMAL HIGH (ref 70–99)
Potassium: 4 mmol/L (ref 3.5–5.1)
Sodium: 135 mmol/L (ref 135–145)

## 2022-01-11 MED ORDER — TAMSULOSIN HCL 0.4 MG PO CAPS
0.4000 mg | ORAL_CAPSULE | Freq: Once | ORAL | Status: AC
  Administered 2022-01-11: 0.4 mg via ORAL
  Filled 2022-01-11: qty 1

## 2022-01-11 MED ORDER — TAMSULOSIN HCL 0.4 MG PO CAPS: 0.4000 mg | ORAL_CAPSULE | Freq: Every day | ORAL | 0 refills | Status: AC

## 2022-01-11 MED ORDER — MORPHINE SULFATE (PF) 4 MG/ML IV SOLN
4.0000 mg | Freq: Once | INTRAVENOUS | Status: DC
  Filled 2022-01-11: qty 1

## 2022-01-11 MED ORDER — KETOROLAC TROMETHAMINE 15 MG/ML IJ SOLN
15.0000 mg | Freq: Once | INTRAMUSCULAR | Status: AC
  Administered 2022-01-11: 15 mg via INTRAVENOUS
  Filled 2022-01-11: qty 1

## 2022-01-11 MED ORDER — IBUPROFEN 800 MG PO TABS: 800.0000 mg | ORAL_TABLET | Freq: Three times a day (TID) | ORAL | 0 refills | Status: AC

## 2022-01-11 MED ORDER — ONDANSETRON 4 MG PO TBDP: 4.0000 mg | ORAL_TABLET | Freq: Three times a day (TID) | ORAL | 0 refills | Status: AC | PRN

## 2022-01-11 MED ORDER — SODIUM CHLORIDE 0.9 % IV BOLUS
1000.0000 mL | Freq: Once | INTRAVENOUS | Status: AC
  Administered 2022-01-11: 1000 mL via INTRAVENOUS

## 2022-01-11 MED ORDER — OXYCODONE-ACETAMINOPHEN 5-325 MG PO TABS: 1.0000 | ORAL_TABLET | Freq: Three times a day (TID) | ORAL | 0 refills | Status: AC | PRN

## 2022-01-11 NOTE — ED Triage Notes (Signed)
Pt here from home with c/o left flank and lower back pain off and on for one week , has not tried anything for the pain , hx of kidney stones

## 2022-01-11 NOTE — Discharge Instructions (Signed)
CT scan shows a kidney stone.  There is no evidence of infection.  Take the pain and nausea medication as prescribed and follow-up with the urologist.  Return to the ED sooner with worsening pain, fever, vomiting, not able to urinate or any other concerns.

## 2022-01-11 NOTE — ED Provider Triage Note (Signed)
Emergency Medicine Provider Triage Evaluation Note  Christopher Moore , a 41 y.o. male  was evaluated in triage.  Pt complains of left sided back/hip pain radiating to groin x 1 week. Pt states pain feels the same as when he had a kidney stone in 2021. No trauma to the area. Intermittent constipation and loose stools.   Review of Systems  Positive: As above Negative: Fever, N/V, dysuria, hematuria  Physical Exam  BP (!) 162/105 (BP Location: Left Arm)   Pulse 90   Temp 98.7 F (37.1 C) (Oral)   Resp 16   SpO2 99%  Gen:   Awake, no distress   Resp:  Normal effort  MSK:   Moves extremities without difficulty  Other:    Medical Decision Making  Medically screening exam initiated at 4:44 PM.  Appropriate orders placed.  Christopher Moore was informed that the remainder of the evaluation will be completed by another provider, this initial triage assessment does not replace that evaluation, and the importance of remaining in the ED until their evaluation is complete.  Workup initiated   Christopher Sans T, PA-C 01/11/22 1655

## 2022-01-11 NOTE — ED Provider Notes (Signed)
[facility]-EMERGENCY DEPT Provider Note   CSN: [id number] Arrival date & time: 01/11/22  1557     History  Chief Complaint  Patient presents with   Flank Pain   Back Pain    Christopher Moore is a 41 y.o. male.  Patient presents with intermittent left-sided flank pain and low back pain for the past 1 week.  Denies any fall or injury.  Pain radiates to his lower abdomen and upper leg coming and going.  Did not take any for it at home.  Pain became worse today.  Associate with nausea but no vomiting.  No fever.  Denies any pain with urination or blood in the urine.  He has had kidney stones previously and this feels similar.  No focal weakness, numbness or tingling.  No incontinence.  Has had intermittent diarrhea. No previous abdominal surgeries.  Has felt chills at home but does not think he had a fever.  Reports history of diabetes, depression and anxiety but not taking any medications currently.  The history is provided by the patient.  Flank Pain Pertinent negatives include no chest pain and no abdominal pain.  Back Pain Associated symptoms: dysuria   Associated symptoms: no abdominal pain, no chest pain and no fever        Home Medications Prior to Admission medications   Medication Sig Start Date End Date Taking? Authorizing Provider  loratadine (CLARITIN) 10 MG tablet Take 10 mg by mouth.    [provider]  metFORMIN (GLUCOPHAGE) 500 MG tablet Take 500 mg by mouth. Reported on 10/30/2015 10/04/15 10/03/16  [provider]  metFORMIN (GLUMETZA) 500 MG (MOD) 24 hr tablet Take 500 mg by mouth daily with breakfast.    [provider]  ondansetron (ZOFRAN ODT) 4 MG disintegrating tablet Take 1 tablet (4 mg total) by mouth every 8 (eight) hours as needed for nausea or vomiting. 10/23/19   Robinson, [place] N, PA-C  oxyCODONE-acetaminophen (PERCOCET/ROXICET) 5-325 MG tablet Take 1-2 tablets by mouth every 6 (six) hours as needed for  severe pain. 04/18/19   Renne Crigler, PA-C  tamsulosin (FLOMAX) 0.4 MG CAPS capsule Take 1 capsule (0.4 mg total) by mouth daily. 10/23/19   Robinson, [place] N, PA-C  fluticasone (FLONASE) 50 MCG/ACT nasal spray Place 2 sprays into both nostrils daily. Patient not taking: Reported on 12/14/2017 09/14/15 04/18/19  Trena Platt D, PA      Allergies    Patient has no known allergies.    Review of Systems   Review of Systems  Constitutional:  Negative for activity change, appetite change and fever.  Respiratory:  Negative for chest tightness.   Cardiovascular:  Negative for chest pain.  Gastrointestinal:  Positive for nausea. Negative for abdominal pain and vomiting.  Genitourinary:  Positive for dysuria and flank pain. Negative for testicular pain.  Musculoskeletal:  Positive for back pain.  Skin:  Negative for rash.   all other systems are negative except as noted in the HPI and PMH.    Physical Exam Updated Vital Signs BP (!) 148/106   Pulse 90   Temp 98.1 F (36.7 C) (Oral)   Resp 18   SpO2 99%  Physical Exam Vitals and nursing note reviewed.  Constitutional:      General: He is not in acute distress.    Appearance: He is well-developed.  HENT:     Head: Normocephalic and atraumatic.     Mouth/Throat:     Pharynx: No oropharyngeal exudate.  Eyes:     Conjunctiva/sclera: Conjunctivae normal.     Pupils: Pupils are equal, round, and reactive to light.  Neck:     Comments: No meningismus. Cardiovascular:     Rate and Rhythm: Normal rate and regular rhythm.     Heart sounds: Normal heart sounds. No murmur heard. Pulmonary:     Effort: Pulmonary effort is normal. No respiratory distress.     Breath sounds: Normal breath sounds.  Abdominal:     Palpations: Abdomen is soft.     Tenderness: There is no abdominal tenderness. There is no guarding or rebound.  Genitourinary:    Comments: No testicular tenderness Musculoskeletal:        General: Tenderness present. Normal  range of motion.     Cervical back: Normal range of motion and neck supple.     Comments: Left paraspinal lumbar tenderness  Skin:    General: Skin is warm.  Neurological:     Mental Status: He is alert and oriented to person, place, and time.     Cranial Nerves: No cranial nerve deficit.     Motor: No abnormal muscle tone.     Coordination: Coordination normal.     Comments:  5/5 strength throughout. CN 2-12 intact.Equal grip strength.   Psychiatric:        Behavior: Behavior normal.     ED Results / Procedures / Treatments   Labs (all labs ordered are listed, but only abnormal results are displayed) Labs Reviewed  URINALYSIS, ROUTINE W REFLEX MICROSCOPIC - Abnormal; Notable for the following components:      Result Value   Glucose, UA 150 (*)    Hgb urine dipstick MODERATE (*)    RBC / HPF >50 (*)    All other components within normal limits  BASIC METABOLIC PANEL - Abnormal; Notable for the following components:   Glucose, Bld 198 (*)    All other components within normal limits  CBC    EKG None  Radiology CT Renal Stone Study  Result Date: 01/11/2022 CLINICAL DATA:  Flank pain, kidney stone suspected EXAM: CT ABDOMEN AND PELVIS WITHOUT CONTRAST TECHNIQUE: Multidetector CT imaging of the abdomen and pelvis was performed following the standard protocol without IV contrast. RADIATION DOSE REDUCTION: This exam was performed according to the departmental dose-optimization program which includes automated exposure control, adjustment of the mA and/or kV according to patient size and/or use of iterative reconstruction technique. COMPARISON:  April 17, 2019 FINDINGS: Evaluation is limited by lack of IV contrast. Lower chest: No acute abnormality. Hepatobiliary: Hepatic steatosis.  Gallbladder is unremarkable. Pancreas: No peripancreatic fat stranding. Spleen: Borderline splenomegaly. Adrenals/Urinary Tract: Adrenal glands are unremarkable. There is a 6 by 7 mm ureterolithiasis in  the mid LEFT ureter with upstream mild-to-moderate hydroureteronephrosis. There is mild adjacent fat stranding. There is an additional nonobstructive LEFT-sided nephrolithiasis. No RIGHT-sided hydronephrosis. Bladder is decompressed. Stomach/Bowel: No evidence of bowel obstruction. Appendix is normal. Stomach is decompressed. Vascular/Lymphatic: No significant vascular findings are present. No enlarged abdominal or pelvic lymph nodes. Reproductive: Prostate is unremarkable. Other: No free air or free fluid. Musculoskeletal: No acute or significant osseous findings. IMPRESSION: 1. There is a 6 x 7 mm ureterolithiasis in the LEFT mid ureter with upstream mild-to-moderate hydroureteronephrosis and adjacent fat stranding. 2. Hepatic steatosis. Electronically Signed   By: Valentino Saxon M.D.   On: 01/11/2022 18:46    Procedures Procedures    Medications Ordered in ED Medications  ketorolac (TORADOL) 15 MG/ML injection 15 mg (has  no administration in time range)  tamsulosin (FLOMAX) capsule 0.4 mg (has no administration in time range)  morphine (PF) 4 MG/ML injection 4 mg (has no administration in time range)  sodium chloride 0.9 % bolus 1,000 mL (has no administration in time range)    ED Course/ Medical Decision Making/ A&P                           Medical Decision Making Amount and/or Complexity of Data Reviewed Labs: ordered. Decision-making details documented in ED Course. Radiology: ordered and independent interpretation performed. Decision-making details documented in ED Course. ECG/medicine tests: ordered and independent interpretation performed. Decision-making details documented in ED Course.  Risk Prescription drug management.   1 week of intermittent left-sided flank pain associated with nausea.  Vital stable, no distress.  No fever.  Low suspicion for cord compression or cauda equina.  Concern for kidney stone and renal colic.  Urinalysis is positive for hemoglobin but no  infection.  No leukocytosis.  Creatinine is at baseline.  CTscan obtained in triage confirms left-sided mid ureteral stone.  Results reviewed and interpreted by me.  Discussed With Dr. Alvester Morin of urology who agrees despite kidney stone size can follow-up as an outpatient for likely lithotripsy.  Pain controlled in the ED.  Patient tolerating p.o.  Will give pain medication, antiemetics, Flomax, urology follow-up.  Turn to the ED with worsening pain, fever, vomiting, unable to urinate or any other concerns.       Final Clinical Impression(s) / ED Diagnoses Final diagnoses:  Kidney stone    Rx / DC Orders ED Discharge Orders     None         Teandra Harlan, Jeannett Senior, MD 01/11/22 2359

## 2023-04-30 ENCOUNTER — Other Ambulatory Visit: Payer: Self-pay | Admitting: Physician Assistant

## 2023-04-30 DIAGNOSIS — R109 Unspecified abdominal pain: Secondary | ICD-10-CM

## 2023-05-15 ENCOUNTER — Encounter (HOSPITAL_BASED_OUTPATIENT_CLINIC_OR_DEPARTMENT_OTHER): Payer: Self-pay

## 2023-05-15 ENCOUNTER — Emergency Department (HOSPITAL_BASED_OUTPATIENT_CLINIC_OR_DEPARTMENT_OTHER): Payer: BC Managed Care – PPO | Admitting: Radiology

## 2023-05-15 ENCOUNTER — Other Ambulatory Visit: Payer: Self-pay

## 2023-05-15 ENCOUNTER — Emergency Department (HOSPITAL_BASED_OUTPATIENT_CLINIC_OR_DEPARTMENT_OTHER)
Admission: EM | Admit: 2023-05-15 | Discharge: 2023-05-15 | Disposition: A | Discharge status: Home / Self Care | Payer: BC Managed Care – PPO | Attending: Emergency Medicine | Admitting: Emergency Medicine

## 2023-05-15 DIAGNOSIS — J101 Influenza due to other identified influenza virus with other respiratory manifestations: Secondary | ICD-10-CM | POA: Insufficient documentation

## 2023-05-15 DIAGNOSIS — Z20822 Contact with and (suspected) exposure to covid-19: Secondary | ICD-10-CM | POA: Insufficient documentation

## 2023-05-15 DIAGNOSIS — I10 Essential (primary) hypertension: Secondary | ICD-10-CM | POA: Diagnosis not present

## 2023-05-15 DIAGNOSIS — E1165 Type 2 diabetes mellitus with hyperglycemia: Secondary | ICD-10-CM | POA: Insufficient documentation

## 2023-05-15 DIAGNOSIS — Z7984 Long term (current) use of oral hypoglycemic drugs: Secondary | ICD-10-CM | POA: Diagnosis not present

## 2023-05-15 DIAGNOSIS — Z79899 Other long term (current) drug therapy: Secondary | ICD-10-CM | POA: Insufficient documentation

## 2023-05-15 DIAGNOSIS — R059 Cough, unspecified: Secondary | ICD-10-CM | POA: Diagnosis present

## 2023-05-15 LAB — CBG MONITORING, ED: Glucose-Capillary: 155 mg/dL — ABNORMAL HIGH (ref 70–99)

## 2023-05-15 LAB — RESP PANEL BY RT-PCR (RSV, FLU A&B, COVID)  RVPGX2
Influenza A by PCR: POSITIVE — AB
Influenza B by PCR: NEGATIVE
Resp Syncytial Virus by PCR: NEGATIVE
SARS Coronavirus 2 by RT PCR: NEGATIVE

## 2023-05-15 MED ORDER — NAPROXEN 500 MG PO TABS: 500.0000 mg | ORAL_TABLET | Freq: Two times a day (BID) | ORAL | 0 refills | Status: AC

## 2023-05-15 MED ORDER — PROMETHAZINE-DM 6.25-15 MG/5ML PO SYRP: 5.0000 mL | ORAL_SOLUTION | Freq: Four times a day (QID) | ORAL | 0 refills | Status: AC | PRN

## 2023-05-15 NOTE — ED Triage Notes (Signed)
Patient arrives ambulatory to ED with complaints of sore throat, cough, chills, and fatigue x4 days.

## 2023-05-15 NOTE — Discharge Instructions (Addendum)
 Please follow-up closely with your primary care doctor on an outpatient basis.  Return to emergency department immediately for any new or worsening symptoms.

## 2023-05-15 NOTE — ED Provider Notes (Signed)
[place] EMERGENCY DEPARTMENT AT [facility] Provider Note   CSN: [id number] Arrival date & time: 05/15/23  8295     History  Chief Complaint  Patient presents with   Sore Throat   Cough    Christopher Moore is a 43 y.o. male.  Patient is a 43 year old male with a past medical history of hypertension, hyperlipidemia, diabetes who presents to the emergency department with a chief complaint of cough, congestion, rhinorrhea, sore throat, generalized malaise and fatigue and fevers which have been ongoing for approximate the past 4 days now.  He does admit to a known exposure to his coworkers who have been sick with similar symptoms.  He has had no associated abdominal pain, nausea, vomiting, diarrhea.  He denies any abnormal rashes, pain to neck or back but does admit to associated headache.   Sore Throat Associated symptoms include headaches.  Cough Associated symptoms: headaches and myalgias        Home Medications Prior to Admission medications   Medication Sig Start Date End Date Taking? Authorizing Provider  ibuprofen (ADVIL) 800 MG tablet Take 1 tablet (800 mg total) by mouth 3 (three) times daily. 01/11/22   Rancour, Jeannett Senior, MD  loratadine (CLARITIN) 10 MG tablet Take 10 mg by mouth.    [provider]  metFORMIN (GLUCOPHAGE) 500 MG tablet Take 500 mg by mouth. Reported on 10/30/2015 10/04/15 10/03/16  [provider]  metFORMIN (GLUMETZA) 500 MG (MOD) 24 hr tablet Take 500 mg by mouth daily with breakfast.    [provider]  ondansetron (ZOFRAN-ODT) 4 MG disintegrating tablet Take 1 tablet (4 mg total) by mouth every 8 (eight) hours as needed for nausea or vomiting. 01/11/22   Rancour, Jeannett Senior, MD  oxyCODONE-acetaminophen (PERCOCET/ROXICET) 5-325 MG tablet Take 1 tablet by mouth every 8 (eight) hours as needed for severe pain. 01/11/22   Rancour, Jeannett Senior, MD  tamsulosin (FLOMAX) 0.4 MG CAPS capsule Take 1 capsule (0.4 mg total) by mouth  daily. 01/11/22   Rancour, Jeannett Senior, MD  fluticasone (FLONASE) 50 MCG/ACT nasal spray Place 2 sprays into both nostrils daily. Patient not taking: Reported on 12/14/2017 09/14/15 04/18/19  Trena Platt D, PA      Allergies    Molds & smuts    Review of Systems   Review of Systems  Respiratory:  Positive for cough.   Musculoskeletal:  Positive for myalgias.  Neurological:  Positive for headaches.  All other systems reviewed and are negative.   Physical Exam Updated Vital Signs BP (!) 140/105 (BP Location: Right Arm)   Pulse (!) 104   Temp 98.4 F (36.9 C) (Oral)   Resp 20   Ht 5\' 6"  (1.676 m)   Wt 114.8 kg   SpO2 97%   BMI 40.84 kg/m  Physical Exam Constitutional:      Appearance: Normal appearance.  HENT:     Head: Normocephalic and atraumatic.     Nose: Nose normal.     Mouth/Throat:     Mouth: Mucous membranes are moist. No oral lesions.     Pharynx: No pharyngeal swelling, oropharyngeal exudate or posterior oropharyngeal erythema.  Eyes:     Extraocular Movements: Extraocular movements intact.     Conjunctiva/sclera: Conjunctivae normal.     Pupils: Pupils are equal, round, and reactive to light.  Cardiovascular:     Rate and Rhythm: Normal rate and regular rhythm.     Pulses: Normal pulses.     Heart sounds: Normal heart sounds.  Pulmonary:  Effort: Pulmonary effort is normal. No respiratory distress.     Breath sounds: Normal breath sounds. No stridor. No wheezing or rhonchi.  Abdominal:     General: Abdomen is flat. Bowel sounds are normal. There is no distension.     Palpations: Abdomen is soft.     Tenderness: There is no abdominal tenderness. There is no guarding.  Musculoskeletal:        General: Normal range of motion.     Cervical back: Normal range of motion and neck supple.  Skin:    General: Skin is warm and dry.     Findings: No rash.  Neurological:     General: No focal deficit present.     Mental Status: He is alert and oriented to  person, place, and time. Mental status is at baseline.  Psychiatric:        Mood and Affect: Mood normal.        Behavior: Behavior normal.        Thought Content: Thought content normal.        Judgment: Judgment normal.     ED Results / Procedures / Treatments   Labs (all labs ordered are listed, but only abnormal results are displayed) Labs Reviewed  RESP PANEL BY RT-PCR (RSV, FLU A&B, COVID)  [id number]    EKG None  Radiology No results found.  Procedures Procedures    Medications Ordered in ED Medications - No data to display  ED Course/ Medical Decision Making/ A&P                                 Medical Decision Making Patient is doing well at this time and is stable for discharge home.  Discussed with patient is positive for influenza A.  Patient has stable vital signs at this point with no indication for sepsis no associated hypoxia.  Chest x-ray demonstrates no signs of consolidation and was reviewed by myself.  Patient does have stable glucose at this point.  Continue symptomatic treatment outpatient basis was discussed.  The need for close follow-up with primary care doctor on outpatient basis was discussed as well as strict turn precautions for any new or worsening symptoms.  Patient voiced understanding to the plan and had no additional questions.  Patient is outside of the window for Tamiflu at this time.  Amount and/or Complexity of Data Reviewed Radiology: ordered.  Risk Prescription drug management.           Final Clinical Impression(s) / ED Diagnoses Final diagnoses:  None    Rx / DC Orders ED Discharge Orders     None         Lelon Perla, PA-C 05/15/23 1109    Horton, Clabe Seal, DO 05/15/23 1149

## 2024-03-31 ENCOUNTER — Other Ambulatory Visit: Payer: Self-pay

## 2024-03-31 ENCOUNTER — Emergency Department (HOSPITAL_COMMUNITY)
Admission: EM | Admit: 2024-03-31 | Discharge: 2024-03-31 | Disposition: A | Attending: Emergency Medicine | Admitting: Emergency Medicine

## 2024-03-31 ENCOUNTER — Encounter (HOSPITAL_COMMUNITY): Payer: Self-pay

## 2024-03-31 DIAGNOSIS — R42 Dizziness and giddiness: Secondary | ICD-10-CM | POA: Insufficient documentation

## 2024-03-31 DIAGNOSIS — E119 Type 2 diabetes mellitus without complications: Secondary | ICD-10-CM | POA: Diagnosis not present

## 2024-03-31 DIAGNOSIS — Z7984 Long term (current) use of oral hypoglycemic drugs: Secondary | ICD-10-CM | POA: Insufficient documentation

## 2024-03-31 LAB — COMPREHENSIVE METABOLIC PANEL WITH GFR
ALT: 11 U/L (ref 0–44)
AST: 18 U/L (ref 15–41)
Albumin: 4.1 g/dL (ref 3.5–5.0)
Alkaline Phosphatase: 77 U/L (ref 38–126)
Anion gap: 12 (ref 5–15)
BUN: 16 mg/dL (ref 6–20)
CO2: 22 mmol/L (ref 22–32)
Calcium: 9.9 mg/dL (ref 8.9–10.3)
Chloride: 99 mmol/L (ref 98–111)
Creatinine, Ser: 0.8 mg/dL (ref 0.61–1.24)
GFR, Estimated: >60 mL/min
Glucose, Bld: 204 mg/dL — ABNORMAL HIGH (ref 70–99)
Potassium: 4 mmol/L (ref 3.5–5.1)
Sodium: 133 mmol/L — ABNORMAL LOW (ref 135–145)
Total Bilirubin: 0.4 mg/dL (ref 0.0–1.2)
Total Protein: 7.7 g/dL (ref 6.5–8.1)

## 2024-03-31 LAB — CBC
HCT: 44.7 % (ref 39.0–52.0)
Hemoglobin: 15.6 g/dL (ref 13.0–17.0)
MCH: 28.9 pg (ref 26.0–34.0)
MCHC: 34.9 g/dL (ref 30.0–36.0)
MCV: 82.9 fL (ref 80.0–100.0)
Platelets: 210 K/uL (ref 150–400)
RBC: 5.39 MIL/uL (ref 4.22–5.81)
RDW: 13 % (ref 11.5–15.5)
WBC: 12.8 K/uL — ABNORMAL HIGH (ref 4.0–10.5)
nRBC: 0 % (ref 0.0–0.2)

## 2024-03-31 LAB — CBG MONITORING, ED: Glucose-Capillary: 207 mg/dL — ABNORMAL HIGH (ref 70–99)

## 2024-03-31 NOTE — Discharge Instructions (Addendum)
 Your lab work and EKG this morning were reassuring.  Please follow-up with your primary care provider for further evaluation.  I do recommend that you continue to take your metformin and your blood pressure medication.

## 2024-03-31 NOTE — ED Triage Notes (Signed)
 Arrives [facility] from home after experiencing dizziness and flushed face feeling.   Says this is the 3rd time he's had an episode like this in the past year.   Upon arrival says he feels fine.   Somewhat compliant with all antihypertensives and metformin. Says he was a little loose with diet this week and unsure if excess sodas played a role.

## 2024-03-31 NOTE — ED Provider Notes (Signed)
 " [place] EMERGENCY DEPARTMENT AT [facility] Provider Note   CSN: [id number] Arrival date & time: 03/31/24  0002     Patient presents with: Dizziness   Christopher Moore is a 43 y.o. male.  Patient with past medical history significant for type II DM, anxiety, depression presents to the emergency department complaining of some vague lightheadedness with a flushed face that occurred this evening.  He states he called EMS and his symptoms subsided prior to arrival at the emergency department.  He states this is the third time it has happened over the past year.  He does endorse occasionally missing his metformin and sometimes missing his blood pressure medication.  Yesterday he states he drank mostly sodas and no water.  He denies chest pain, shortness of breath, abdominal pain, nausea, vomiting.  He is hungry and thirsty at this time.    Dizziness      Prior to Admission medications  Medication Sig Start Date End Date Taking? Authorizing Provider  ibuprofen  (ADVIL ) 800 MG tablet Take 1 tablet (800 mg total) by mouth 3 (three) times daily. 01/11/22   Rancour, Garnette, MD  loratadine (CLARITIN) 10 MG tablet Take 10 mg by mouth.    [provider]  metFORMIN (GLUCOPHAGE) 500 MG tablet Take 500 mg by mouth. Reported on 10/30/2015 10/04/15 10/03/16  [provider]  metFORMIN (GLUMETZA) 500 MG (MOD) 24 hr tablet Take 500 mg by mouth daily with breakfast.    [provider]  naproxen  (NAPROSYN ) 500 MG tablet Take 1 tablet (500 mg total) by mouth 2 (two) times daily. 05/15/23   Daralene Lonni BIRCH, PA-C  ondansetron  (ZOFRAN -ODT) 4 MG disintegrating tablet Take 1 tablet (4 mg total) by mouth every 8 (eight) hours as needed for nausea or vomiting. 01/11/22   Rancour, Garnette, MD  oxyCODONE -acetaminophen  (PERCOCET/ROXICET) 5-325 MG tablet Take 1 tablet by mouth every 8 (eight) hours as needed for severe pain. 01/11/22   Rancour, Garnette, MD   promethazine -dextromethorphan (PROMETHAZINE -DM) 6.25-15 MG/5ML syrup Take 5 mLs by mouth 4 (four) times daily as needed for cough. 05/15/23   Daralene Lonni BIRCH, PA-C  tamsulosin  (FLOMAX ) 0.4 MG CAPS capsule Take 1 capsule (0.4 mg total) by mouth daily. 01/11/22   Rancour, Garnette, MD  fluticasone  (FLONASE ) 50 MCG/ACT nasal spray Place 2 sprays into both nostrils daily. Patient not taking: Reported on 12/14/2017 09/14/15 04/18/19  Isadora Krabbe D, PA    Allergies: Molds & smuts    Review of Systems  Neurological:  Positive for dizziness.    Updated Vital Signs BP (!) 142/92   Pulse 96   Temp 97.9 F (36.6 C)   Resp 18   Ht 5' 6 (1.676 m)   Wt 114.8 kg   SpO2 97%   BMI 40.84 kg/m   Physical Exam Vitals and nursing note reviewed.  Constitutional:      General: He is not in acute distress.    Appearance: He is well-developed.  HENT:     Head: Normocephalic and atraumatic.  Eyes:     Conjunctiva/sclera: Conjunctivae normal.  Cardiovascular:     Rate and Rhythm: Normal rate and regular rhythm.  Pulmonary:     Effort: Pulmonary effort is normal. No respiratory distress.     Breath sounds: Normal breath sounds.  Abdominal:     Palpations: Abdomen is soft.     Tenderness: There is no abdominal tenderness.  Musculoskeletal:        General: No swelling.  Cervical back: Neck supple.  Skin:    General: Skin is warm and dry.     Capillary Refill: Capillary refill takes less than 2 seconds.  Neurological:     General: No focal deficit present.     Mental Status: He is alert.  Psychiatric:        Mood and Affect: Mood normal.     (all labs ordered are listed, but only abnormal results are displayed) Labs Reviewed  CBC - Abnormal; Notable for the following components:      Result Value   WBC 12.8 (*)    All other components within normal limits  COMPREHENSIVE METABOLIC PANEL WITH GFR - Abnormal; Notable for the following components:   Sodium 133 (*)    Glucose, Bld  204 (*)    All other components within normal limits  CBG MONITORING, ED - Abnormal; Notable for the following components:   Glucose-Capillary 207 (*)    All other components within normal limits    EKG: None  Radiology: No results found.   Procedures   Medications Ordered in the ED - No data to display                                  Medical Decision Making Amount and/or Complexity of Data Reviewed Labs: ordered.   This patient presents to the ED for concern of weakness, this involves an extensive number of treatment options, and is a complaint that carries with it a high risk of complications and morbidity.  The differential diagnosis includes hyperglycemia, hypoglycemia, other metabolic abnormality, electrolyte abnormality, dehydration, dysrhythmia, others   Co morbidities / Chronic conditions that complicate the patient evaluation  As noted in HPI   Additional history obtained:  Additional history obtained from EMR   Lab Tests:  I Ordered, and personally interpreted labs.  The pertinent results include: White count of 12,800    Cardiac Monitoring: / EKG:  The patient was maintained on a cardiac monitor.  I personally viewed and interpreted the cardiac monitored which showed an underlying rhythm of: Sinus tachycardia    Test / Admission - Considered:  Patient is ambulatory with no lightheadedness or dizziness.  He is drinking water with no difficulty.  He is asymptomatic at this time.  It is unclear as to what caused his earlier symptoms.  I have recommended he resume his blood pressure medication and continue to take his metformin as prescribed.  I have also recommended that he follow-up with his primary care provider.  Labs were unremarkable.  He was borderline tachycardic upon arrival but upon reassessment his heart rate was in the 80s.  At this time I see no indication for further emergent workup or admission.  Patient stable for discharge home with return  precautions provided.      Final diagnoses:  Lightheadedness    ED Discharge Orders     None          Logan Ubaldo KATHEE DEVONNA 03/31/24 9379  "

## 2024-04-03 ENCOUNTER — Emergency Department (HOSPITAL_COMMUNITY)

## 2024-04-03 ENCOUNTER — Emergency Department (HOSPITAL_COMMUNITY)
Admission: EM | Admit: 2024-04-03 | Discharge: 2024-04-03 | Disposition: A | Discharge status: Home / Self Care | Attending: Emergency Medicine | Admitting: Emergency Medicine

## 2024-04-03 ENCOUNTER — Other Ambulatory Visit: Payer: Self-pay

## 2024-04-03 ENCOUNTER — Encounter (HOSPITAL_COMMUNITY): Payer: Self-pay | Admitting: Emergency Medicine

## 2024-04-03 DIAGNOSIS — I10 Essential (primary) hypertension: Secondary | ICD-10-CM | POA: Insufficient documentation

## 2024-04-03 DIAGNOSIS — F419 Anxiety disorder, unspecified: Secondary | ICD-10-CM | POA: Diagnosis not present

## 2024-04-03 LAB — BASIC METABOLIC PANEL WITH GFR
Anion gap: 17 — ABNORMAL HIGH (ref 5–15)
BUN: 18 mg/dL (ref 6–20)
CO2: 19 mmol/L — ABNORMAL LOW (ref 22–32)
Calcium: 10.4 mg/dL — ABNORMAL HIGH (ref 8.9–10.3)
Chloride: 98 mmol/L (ref 98–111)
Creatinine, Ser: 0.9 mg/dL (ref 0.61–1.24)
GFR, Estimated: >60 mL/min
Glucose, Bld: 213 mg/dL — ABNORMAL HIGH (ref 70–99)
Potassium: 3.9 mmol/L (ref 3.5–5.1)
Sodium: 135 mmol/L (ref 135–145)

## 2024-04-03 LAB — CBC
HCT: 46.9 % (ref 39.0–52.0)
Hemoglobin: 16.1 g/dL (ref 13.0–17.0)
MCH: 28.4 pg (ref 26.0–34.0)
MCHC: 34.3 g/dL (ref 30.0–36.0)
MCV: 82.9 fL (ref 80.0–100.0)
Platelets: 249 K/uL (ref 150–400)
RBC: 5.66 MIL/uL (ref 4.22–5.81)
RDW: 12.9 % (ref 11.5–15.5)
WBC: 13.7 K/uL — ABNORMAL HIGH (ref 4.0–10.5)
nRBC: 0 % (ref 0.0–0.2)

## 2024-04-03 LAB — TROPONIN T, HIGH SENSITIVITY: Troponin T High Sensitivity: <15 ng/L (ref 0–19)

## 2024-04-03 MED ORDER — LORAZEPAM 0.5 MG PO TABS
0.5000 mg | ORAL_TABLET | Freq: Once | ORAL | Status: AC
  Administered 2024-04-03: 0.5 mg via ORAL
  Filled 2024-04-03: qty 1

## 2024-04-03 MED ORDER — HYDROXYZINE HCL 25 MG PO TABS
25.0000 mg | ORAL_TABLET | Freq: Once | ORAL | Status: AC
  Administered 2024-04-03: 25 mg via ORAL
  Filled 2024-04-03: qty 1

## 2024-04-03 NOTE — Discharge Instructions (Signed)
 Your workup this morning was reassuring.  I believe that anxiety may be contributing to your recent symptoms.  I recommend following up with your primary care provider for further management and evaluation.  Please also discuss your recent high blood pressure with your primary care provider.  Return to the emergency department if you develop any life-threatening symptoms.

## 2024-04-03 NOTE — ED Triage Notes (Signed)
 Patient BIB EMS c/o hypertension x 1 hour and intermittent epigastric pain tonight. Per EMS patient Initial BP was 200/100 at home. Patient report he is compliant with medication. Patient denies N/V/D. Patient denies chest pain and SOB.

## 2024-04-03 NOTE — ED Provider Notes (Signed)
 " [place] EMERGENCY DEPARTMENT AT [facility] Provider Note   CSN: [id number] Arrival date & time: 04/03/24  0124     Patient presents with: Hypertension   Christopher Moore is a 43 y.o. male.  Patient with past medical history significant hypertension, type II DM, anxiety presents to the emergency department via EMS.  He reports feeling vague paresthesias in the left side of his face and then noticing that he was hypertensive.  He called EMS.  EMS reported initial BP of 200/100.  Upon arrival blood pressure improved to 160/115.  He states that his paresthesias had also subsided.  He endorses a vague tension feeling around his head.  He endorses being compliant with his medications.  He was seen by myself personally on December 19 due to lightheadedness.  At that time he also discussed hypertension and tension but declined a head CT.  At this time the patient is asymptomatic other than feeling anxious and feeling the tension feeling around his head.  He has remained tachycardic since arrival and was administered hydroxyzine  in triage due to reported anxiety.  He states he has had lots of personal stressors and has had a hard time going back to work.  He states he will try to go back to work for the first time since his recent emergency department visit and was only able to stay 3 hours today before anxiety made him leave.  He denies chest pain, shortness of breath, abdominal pain, nausea, vomiting, fever.    Hypertension       Prior to Admission medications  Medication Sig Start Date End Date Taking? Authorizing Provider  ibuprofen  (ADVIL ) 800 MG tablet Take 1 tablet (800 mg total) by mouth 3 (three) times daily. 01/11/22   Rancour, Garnette, MD  loratadine (CLARITIN) 10 MG tablet Take 10 mg by mouth.    [provider]  metFORMIN (GLUCOPHAGE) 500 MG tablet Take 500 mg by mouth. Reported on 10/30/2015 10/04/15 10/03/16  [provider]  metFORMIN (GLUMETZA) 500  MG (MOD) 24 hr tablet Take 500 mg by mouth daily with breakfast.    [provider]  naproxen  (NAPROSYN ) 500 MG tablet Take 1 tablet (500 mg total) by mouth 2 (two) times daily. 05/15/23   Daralene Lonni BIRCH, PA-C  ondansetron  (ZOFRAN -ODT) 4 MG disintegrating tablet Take 1 tablet (4 mg total) by mouth every 8 (eight) hours as needed for nausea or vomiting. 01/11/22   Rancour, Garnette, MD  oxyCODONE -acetaminophen  (PERCOCET/ROXICET) 5-325 MG tablet Take 1 tablet by mouth every 8 (eight) hours as needed for severe pain. 01/11/22   Rancour, Garnette, MD  promethazine -dextromethorphan (PROMETHAZINE -DM) 6.25-15 MG/5ML syrup Take 5 mLs by mouth 4 (four) times daily as needed for cough. 05/15/23   Daralene Lonni BIRCH, PA-C  tamsulosin  (FLOMAX ) 0.4 MG CAPS capsule Take 1 capsule (0.4 mg total) by mouth daily. 01/11/22   Rancour, Garnette, MD  fluticasone  (FLONASE ) 50 MCG/ACT nasal spray Place 2 sprays into both nostrils daily. Patient not taking: Reported on 12/14/2017 09/14/15 04/18/19  Isadora Krabbe D, PA    Allergies: Molds & smuts    Review of Systems  Updated Vital Signs BP (!) 144/92 (BP Location: Right Arm)   Pulse (!) 101   Temp 98.1 F (36.7 C) (Oral)   Resp 20   SpO2 99%   Physical Exam Vitals and nursing note reviewed.  Constitutional:      General: He is not in acute distress.    Appearance: He is well-developed.  HENT:     Head: Normocephalic and atraumatic.  Eyes:     Conjunctiva/sclera: Conjunctivae normal.  Cardiovascular:     Rate and Rhythm: Regular rhythm. Tachycardia present.  Pulmonary:     Effort: Pulmonary effort is normal. No respiratory distress.     Breath sounds: Normal breath sounds.  Abdominal:     Palpations: Abdomen is soft.     Tenderness: There is no abdominal tenderness.  Musculoskeletal:        General: No swelling.     Cervical back: Neck supple.  Skin:    General: Skin is warm and dry.     Capillary Refill: Capillary refill takes less than 2  seconds.  Neurological:     General: No focal deficit present.     Mental Status: He is alert.  Psychiatric:        Mood and Affect: Mood normal.     (all labs ordered are listed, but only abnormal results are displayed) Labs Reviewed  BASIC METABOLIC PANEL WITH GFR - Abnormal; Notable for the following components:      Result Value   CO2 19 (*)    Glucose, Bld 213 (*)    Calcium 10.4 (*)    Anion gap 17 (*)    All other components within normal limits  CBC - Abnormal; Notable for the following components:   WBC 13.7 (*)    All other components within normal limits  TROPONIN T, HIGH SENSITIVITY    EKG: EKG Interpretation Date/Time:  Monday April 03 2024 01:46:38 EST Ventricular Rate:  128 PR Interval:  137 QRS Duration:  109 QT Interval:  321 QTC Calculation: 469 R Axis:   -29  Text Interpretation: Sinus tachycardia Borderline left axis deviation RSR' in V1 or V2, probably normal variant Confirmed by Trine Likes [phone]) on 04/03/2024 4:25:32 AM  Radiology: CT Head Wo Contrast Result Date: 04/03/2024 EXAM: CT HEAD WITHOUT CONTRAST 04/03/2024 05:22:37 AM TECHNIQUE: CT of the head was performed without the administration of intravenous contrast. Automated exposure control, iterative reconstruction, and/or weight based adjustment of the mA/kV was utilized to reduce the radiation dose to as low as reasonably achievable. COMPARISON: None available. CLINICAL HISTORY: Headache, increasing frequency or severity; Hypertension with new headache FINDINGS: BRAIN AND VENTRICLES: No acute hemorrhage. No evidence of acute infarct. No hydrocephalus. No extra-axial collection. No mass effect or midline shift. ORBITS: No acute abnormality. SINUSES: No acute abnormality. SOFT TISSUES AND SKULL: No acute soft tissue abnormality. No skull fracture. IMPRESSION: 1. No acute intracranial abnormality. Electronically signed by: Evalene Coho MD 04/03/2024 05:29 AM EST RP Workstation: [id number]      Procedures   Medications Ordered in the ED  hydrOXYzine  (ATARAX ) tablet 25 mg (25 mg Oral Given 04/03/24 0304)  LORazepam  (ATIVAN ) tablet 0.5 mg (0.5 mg Oral Given 04/03/24 0540)                                    Medical Decision Making Amount and/or Complexity of Data Reviewed Labs: ordered. Radiology: ordered.  Risk Prescription drug management.   This patient presents to the ED for concern of hypertension with mild headache, this involves an extensive number of treatment options, and is a complaint that carries with it a high risk of complications and morbidity.  The differential diagnosis includes intracranial abnormality, hypertensive emergency, tension headache, cluster headache, others   Co morbidities / Chronic conditions that complicate the patient  evaluation  As noted in HPI   Additional history obtained:  Additional history obtained from EMR  Lab Tests:  I Ordered, and personally interpreted labs.  The pertinent results include: Normal creatinine, unremarkable CBC, troponin less than 15   Imaging Studies ordered:  I ordered imaging studies including head CT without contrast I independently visualized and interpreted imaging which showed no acute findings I agree with the radiologist interpretation   Cardiac Monitoring: / EKG:  The patient was maintained on a cardiac monitor.  I personally viewed and interpreted the cardiac monitored which showed an underlying rhythm of: Sinus tachycardia   Problem List / ED Course / Critical interventions / Medication management   I ordered medication including hydroxyzine  and Ativan  Reevaluation of the patient after these medicines showed that the patient improved I have reviewed the patients home medicines and have made adjustments as needed   Test / Admission - Considered:  Patient with reassuring workup with unremarkable labs and normal head CT.  He endorses multiple external factors contributing to  anxiety.  I believe at this time that anxiety may be the treat contributor to his recent presentations.  Patient is going to follow-up with his primary care provider for further evaluation as needed.  Return precautions provided.      Final diagnoses:  Anxiety  Hypertension, unspecified type    ED Discharge Orders     None          Logan Ubaldo KATHEE DEVONNA 04/03/24 9391    Trine Raynell Moder, MD 04/03/24 [phone]  "

## 2024-05-01 ENCOUNTER — Emergency Department (HOSPITAL_COMMUNITY)

## 2024-05-01 ENCOUNTER — Emergency Department (HOSPITAL_COMMUNITY)
Admission: EM | Admit: 2024-05-01 | Discharge: 2024-05-01 | Disposition: A | Discharge status: Home / Self Care | Attending: Emergency Medicine | Admitting: Emergency Medicine

## 2024-05-01 ENCOUNTER — Other Ambulatory Visit: Payer: Self-pay

## 2024-05-01 ENCOUNTER — Encounter (HOSPITAL_COMMUNITY): Payer: Self-pay | Admitting: Emergency Medicine

## 2024-05-01 DIAGNOSIS — R000 Tachycardia, unspecified: Secondary | ICD-10-CM | POA: Diagnosis not present

## 2024-05-01 DIAGNOSIS — I10 Essential (primary) hypertension: Secondary | ICD-10-CM | POA: Insufficient documentation

## 2024-05-01 DIAGNOSIS — E119 Type 2 diabetes mellitus without complications: Secondary | ICD-10-CM | POA: Insufficient documentation

## 2024-05-01 DIAGNOSIS — R079 Chest pain, unspecified: Secondary | ICD-10-CM | POA: Insufficient documentation

## 2024-05-01 DIAGNOSIS — Z7984 Long term (current) use of oral hypoglycemic drugs: Secondary | ICD-10-CM | POA: Insufficient documentation

## 2024-05-01 DIAGNOSIS — Z79899 Other long term (current) drug therapy: Secondary | ICD-10-CM | POA: Insufficient documentation

## 2024-05-01 LAB — CBC
HCT: 46.7 % (ref 39.0–52.0)
Hemoglobin: 16.3 g/dL (ref 13.0–17.0)
MCH: 28.8 pg (ref 26.0–34.0)
MCHC: 34.9 g/dL (ref 30.0–36.0)
MCV: 82.5 fL (ref 80.0–100.0)
Platelets: 279 K/uL (ref 150–400)
RBC: 5.66 MIL/uL (ref 4.22–5.81)
RDW: 12.7 % (ref 11.5–15.5)
WBC: 9.6 K/uL (ref 4.0–10.5)
nRBC: 0 % (ref 0.0–0.2)

## 2024-05-01 LAB — BASIC METABOLIC PANEL WITH GFR
Anion gap: 20 — ABNORMAL HIGH (ref 5–15)
BUN: 10 mg/dL (ref 6–20)
CO2: 20 mmol/L — ABNORMAL LOW (ref 22–32)
Calcium: 10.3 mg/dL (ref 8.9–10.3)
Chloride: 100 mmol/L (ref 98–111)
Creatinine, Ser: 0.91 mg/dL (ref 0.61–1.24)
GFR, Estimated: >60 mL/min
Glucose, Bld: 182 mg/dL — ABNORMAL HIGH (ref 70–99)
Potassium: 3.8 mmol/L (ref 3.5–5.1)
Sodium: 139 mmol/L (ref 135–145)

## 2024-05-01 LAB — D-DIMER, QUANTITATIVE: D-Dimer, Quant: <0.27 ug{FEU}/mL (ref 0.00–0.50)

## 2024-05-01 LAB — TROPONIN T, HIGH SENSITIVITY
Troponin T High Sensitivity: <15 ng/L (ref 0–19)
Troponin T High Sensitivity: <15 ng/L (ref 0–19)

## 2024-05-01 MED ORDER — METFORMIN HCL 500 MG PO TABS: 500.0000 mg | ORAL_TABLET | Freq: Once | ORAL | Status: DC

## 2024-05-01 MED ORDER — METFORMIN HCL ER 500 MG PO TB24
500.0000 mg | ORAL_TABLET | ORAL | Status: AC
  Administered 2024-05-01: 500 mg via ORAL
  Filled 2024-05-01: qty 1

## 2024-05-01 MED ORDER — SODIUM CHLORIDE 0.9 % IV BOLUS
1000.0000 mL | Freq: Once | INTRAVENOUS | Status: AC
  Administered 2024-05-01: 1000 mL via INTRAVENOUS

## 2024-05-01 NOTE — ED Triage Notes (Addendum)
 PT BIB [facility] from home with a co of intermittent CP which started today.PT is tachy in the 140's, no n/v or SOB but has diarrhea x months.PT has reportedly started a new anxiety medication last week. EMS gave a 500 bolus of NS

## 2024-05-01 NOTE — ED Provider Notes (Signed)
 " [place] EMERGENCY DEPARTMENT AT [facility] Provider Note   CSN: [id number] Arrival date & time: 05/01/24  1700     Patient presents with: Chest Pain   Christopher Moore is a 44 y.o. male.   Patient is a 44 yo male with pmh of anxiety, htn, and dm presenting for complaints of chest pain and tachycardia. Pt states he has had an elevated HR that started this morning upon waking. States HR was above 110 but went to 160 bpm after walking. Admits to intermittent chest discomfort, right sided, non-radiating, for a while, and states it is likely secondary to anxiety. No hx of cardiac arrhythmias. Denies fevers, chills, coughing, or URI symptoms. Denies leg swelling, DVT, or PE. Denies sob or wheezing. Denies smoking or vaping. BP on arrival 169/97. Takes lisinopril daily.   Admits to long-term anxiety. Sees pcp for it. Previously ativan , 2 pills twice a day, stopped last week. Dosed down by pcp. Now on Prozac-day 5. Admits to many triggers. States he is eating less due to current anxiety.   The history is provided by the patient. No language interpreter was used.  Chest Pain Associated symptoms: palpitations   Associated symptoms: no abdominal pain, no back pain, no cough, no fever, no shortness of breath and no vomiting        Prior to Admission medications  Medication Sig Start Date End Date Taking? Authorizing Provider  ibuprofen  (ADVIL ) 800 MG tablet Take 1 tablet (800 mg total) by mouth 3 (three) times daily. 01/11/22   Rancour, Garnette, MD  loratadine (CLARITIN) 10 MG tablet Take 10 mg by mouth.    [provider]  metFORMIN  (GLUCOPHAGE ) 500 MG tablet Take 500 mg by mouth. Reported on 10/30/2015 10/04/15 10/03/16  [provider]  metFORMIN  (GLUMETZA ) 500 MG (MOD) 24 hr tablet Take 500 mg by mouth daily with breakfast.    [provider]  naproxen  (NAPROSYN ) 500 MG tablet Take 1 tablet (500 mg total) by mouth 2 (two) times daily. 05/15/23   Daralene Lonni BIRCH, PA-C  ondansetron  (ZOFRAN -ODT) 4 MG disintegrating tablet Take 1 tablet (4 mg total) by mouth every 8 (eight) hours as needed for nausea or vomiting. 01/11/22   Rancour, Garnette, MD  oxyCODONE -acetaminophen  (PERCOCET/ROXICET) 5-325 MG tablet Take 1 tablet by mouth every 8 (eight) hours as needed for severe pain. 01/11/22   Rancour, Garnette, MD  promethazine -dextromethorphan (PROMETHAZINE -DM) 6.25-15 MG/5ML syrup Take 5 mLs by mouth 4 (four) times daily as needed for cough. 05/15/23   Daralene Lonni BIRCH, PA-C  tamsulosin  (FLOMAX ) 0.4 MG CAPS capsule Take 1 capsule (0.4 mg total) by mouth daily. 01/11/22   Rancour, Garnette, MD  fluticasone  (FLONASE ) 50 MCG/ACT nasal spray Place 2 sprays into both nostrils daily. Patient not taking: Reported on 12/14/2017 09/14/15 04/18/19  Isadora Krabbe D, PA    Allergies: Molds & smuts    Review of Systems  Constitutional:  Negative for chills and fever.  HENT:  Negative for ear pain and sore throat.   Eyes:  Negative for pain and visual disturbance.  Respiratory:  Negative for cough and shortness of breath.   Cardiovascular:  Positive for chest pain and palpitations.  Gastrointestinal:  Negative for abdominal pain and vomiting.  Genitourinary:  Negative for dysuria and hematuria.  Musculoskeletal:  Negative for arthralgias and back pain.  Skin:  Negative for color change and rash.  Neurological:  Negative for seizures and syncope.  All other systems reviewed and are negative.  Updated Vital Signs BP (!) 144/93   Pulse 96   Temp 98.5 F (36.9 C)   Resp 13   Ht 5' 6 (1.676 m)   Wt 105.2 kg   SpO2 100%   BMI 37.45 kg/m   Physical Exam Vitals and nursing note reviewed.  Constitutional:      General: He is not in acute distress.    Appearance: He is well-developed.  HENT:     Head: Normocephalic and atraumatic.  Eyes:     Conjunctiva/sclera: Conjunctivae normal.  Cardiovascular:     Rate and Rhythm: Normal rate and regular  rhythm.     Heart sounds: No murmur heard. Pulmonary:     Effort: Pulmonary effort is normal. No respiratory distress.     Breath sounds: Normal breath sounds.  Abdominal:     Palpations: Abdomen is soft.     Tenderness: There is no abdominal tenderness.  Musculoskeletal:        General: No swelling.     Cervical back: Neck supple.  Skin:    General: Skin is warm and dry.     Capillary Refill: Capillary refill takes less than 2 seconds.  Neurological:     Mental Status: He is alert.  Psychiatric:        Mood and Affect: Mood normal.     (all labs ordered are listed, but only abnormal results are displayed) Labs Reviewed  BASIC METABOLIC PANEL WITH GFR - Abnormal; Notable for the following components:      Result Value   CO2 20 (*)    Glucose, Bld 182 (*)    Anion gap 20 (*)    All other components within normal limits  CBC  D-DIMER, QUANTITATIVE (NOT AT [facility])  TROPONIN T, HIGH SENSITIVITY  TROPONIN T, HIGH SENSITIVITY    EKG: None  Radiology: DG Chest 2 View Result Date: 05/01/2024 EXAM: 2 VIEW(S) XRAY OF THE CHEST 05/01/2024 06:20:00 PM COMPARISON: Chest x-ray dated 05/15/2023. CLINICAL HISTORY: CP FINDINGS: LUNGS AND PLEURA: No focal pulmonary opacity. No pleural effusion. No pneumothorax. HEART AND MEDIASTINUM: No acute abnormality of the cardiac and mediastinal silhouettes. BONES AND SOFT TISSUES: No acute osseous abnormality. IMPRESSION: 1. No acute cardiopulmonary pathology. Electronically signed by: Greig Pique MD 05/01/2024 06:30 PM EST RP Workstation: [id number]     Procedures   Medications Ordered in the ED  sodium chloride  0.9 % bolus 1,000 mL (0 mLs Intravenous Stopped 05/01/24 1941)  metFORMIN  (GLUCOPHAGE -XR) 24 hr tablet 500 mg (500 mg Oral Given 05/01/24 2153)                                    Medical Decision Making Amount and/or Complexity of Data Reviewed Labs: ordered. Radiology: ordered.  Risk Prescription drug management.    44 yo  male with pmh of anxiety, htn, and dm presenting for complaints of chest pain and tachycardia.  Patient is alert and O x 3, no acute distress, afebrile, stable vital signs   The patient's chest pain is not suggestive of pulmonary embolus, cardiac ischemia, aortic dissection, pericarditis, myocarditis, pulmonary embolism, pneumothorax, pneumonia, Zoster, or esophageal perforation, or other serious etiology.  Historically not abrupt in onset, tearing or ripping, pulses symmetric. EKG nonspecific for ischemia/infarction. No dysrhythmias, brugada, WPW, prolonged QT noted. CXR reviewed and WNL. Troponin negative x2.  D-dimer negative.  CXR reviewed. Labs without demonstration of acute pathology unless otherwise noted above.  Concerns that symptoms are likely secondary to hypertension and/or anxiety.  Patient's labs did suggest some mild dehydration.  He states he recently had diarrhea.  IV fluids given.  Tachycardia improved from 140 on arrival to 109.  Patient's hypertension is controlled with lisinopril at home.  Currently it ranges from 169/97 to 144/93 without intervention.  It tends to increase while speaking with the patient.  He states his blood pressure readings at home are both high and low.  I recommend he keep a home log and follow-up with his PCP.  He states he has a PCP appointment in 2 days.  Low HEART Score: 0-3 points (0.9-1.7% risk of MACE).] Given the extremely low risk of these diagnoses further testing and evaluation for these possibilities does not appear to be indicated at this time. Patient in no distress and overall condition improved here in the ED. Detailed discussions were had with the patient regarding current findings, and need for close f/u with PCP or on call doctor. The patient has been instructed to return immediately if the symptoms worsen in any way for re-evaluation. Patient verbalized understanding and is in agreement with current care plan. All questions answered prior to  discharge.      Final diagnoses:  Chest pain, unspecified type    ED Discharge Orders     None          Elnor Bernarda SQUIBB, DO 05/01/24 2351  "

## 2024-05-02 ENCOUNTER — Emergency Department (HOSPITAL_COMMUNITY)
Admission: EM | Admit: 2024-05-02 | Discharge: 2024-05-02 | Disposition: A | Discharge status: Home / Self Care | Attending: Emergency Medicine | Admitting: Emergency Medicine

## 2024-05-02 ENCOUNTER — Encounter (HOSPITAL_COMMUNITY): Payer: Self-pay

## 2024-05-02 ENCOUNTER — Ambulatory Visit (HOSPITAL_COMMUNITY): Admission: EM | Admit: 2024-05-02 | Discharge: 2024-05-02 | Disposition: A | Discharge status: Home / Self Care

## 2024-05-02 ENCOUNTER — Other Ambulatory Visit: Payer: Self-pay

## 2024-05-02 ENCOUNTER — Telehealth: Payer: Self-pay | Admitting: *Deleted

## 2024-05-02 DIAGNOSIS — F419 Anxiety disorder, unspecified: Secondary | ICD-10-CM

## 2024-05-02 MED ORDER — LORAZEPAM 1 MG PO TABS
1.0000 mg | ORAL_TABLET | Freq: Once | ORAL | Status: DC
  Filled 2024-05-02: qty 1

## 2024-05-02 NOTE — Telephone Encounter (Signed)
 Pt called regarding tapering off Ativan .  Pt states he is becoming anxious and wondering if he should start taking again.  RNCM advised pt to contact PCP (call or mychart) for guidance as they are managing Rx for him.  Pt verbalized instructions.   Sabrinna Yearwood J. Debarah, BSN, RN, [facility]  Inpatient Care Management  Nurse Case Manager  [facility]  Operative Services  [phone]

## 2024-05-02 NOTE — Discharge Instructions (Signed)

## 2024-05-02 NOTE — ED Provider Notes (Signed)
 Behavioral Health Urgent Care Medical Screening Exam  Patient Name: Christopher Moore MRN: [id number] Date of Evaluation: 05/02/24 Chief Complaint:  worsening anxiety. Diagnosis:  Final diagnoses:  Anxiety disorder, unspecified type   History of Present illness: Christopher Moore is a 44 y.o. male who presents to the [facility], accompanied by a friend with complaints of worsening anxiety since the cessation of his Ativan  by his PCP.    Assessment: During encounter with patient, he is visibly restless, talks about anxiety worsening since his Ativan  was abruptly stopped by his primary care provider.  He reports that he was being prescribed 0.5 mg of Ativan  4 times a day from April 04, 2024 to April 27, 2024.  Patient reports that the medication was gradually tapered off while he was prescribed Prozac, and reports that he is currently on Prozac 20 mg, is beginning to feel it work, but it is not to the point where he feels as though it is sustaining him yet, and that is why he is here.  Patient shares that when he went to the [facility], ER earlier today, he was given an Ativan , and that the medication was helpful in calming him down, and shares that hydroxyzine  did not and for him when it was given to him a few days prior to that.  Patient states that his hearing responds to writer telling him that he would be given a hydroxyzine  25 mg TID PRN for anxiety. He stated: No, thank you! Prior to stating the above, and clinical research associate informed him that this medication would not be prescribed at this location due to its tendency for dependence. Writer also educated on the need to establish care with an outpatient mental health provider for continuity of care and to more accurately monitor his symptoms. Writer educated on the fact that he needs to give the Prozac time to work, and resources for mental health were placed in patient's AVS who was educated to call tomorrow to make an appt with behavioral health  services in network with his insurance for continuity of care.  Patient denies SI, denies HI, denies AVH, has no overt signs of psychosis.  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  Reports some depressive symptoms including insomnia, anhedonia, trouble with concentration and appetite, but is currently being treated, states that he only recently started the Prozac.  Educated that the Prozac will help with both GAD and MDD, verbalizes understanding.  All indications seem to point to the fact the patient is seeking Ativan , as he refuses offers for hydroxyzine , and persistently asked to be given Ativan  which writer does not comply with.  Patient reports a history of hypertension, and diabetes.  Review of his chart also is that blood pressure stays in the systolic BP of high 140s, while heart rate is also persistently elevated.  Patient reports that he takes lisinopril 20 mg daily for hypertension, states that he took this medication earlier today.  He has been educated to follow-up with his primary care provider regarding this.  Has verbalized understanding.  He left our facility, in no acute distress, agreeable to follow-up on an outpatient basis, denies suicidal ideations, denies homicidal ideations, denies intent or plan to harm himself or anyone else in the community.  Agreeable to seek care in the event that he begins feeling suicidal or homicidal.  Educated as follows: Get help right away if: You have thoughts about hurting yourself or others. Get help right away if you feel like you may hurt yourself or others, or have thoughts about taking your own life. Go to your nearest emergency room or: Call 911. Call the National Suicide Prevention Lifeline at 531 239 9341 or 988 in the [place] This is open 24 hours a day. If youre a  Veteran: Call 988 and press 1. This is open 24 hours a day. Text the [employer] at 816 540 8353. Summary Mental health is not just the absence of mental illness. It involves understanding your emotions and behaviors, and taking steps to manage them in a healthy way. If you have symptoms of mental or emotional distress, get help from family, friends, a health care provider, or a mental health professional. Practice good mental health behaviors such as stress management skills, self-calming skills, exercise, healthy sleeping and eating, and supportive relationships. This information is not intended to replace advice given to you by your health care provider. Make sure you discuss any questions you have with your health care provider.   Education provided on the fact that if experiencing worsening of psychiatry symptoms including suicidal ideations, homicidal ideations, or having auditory/visual hallucinations, etc, to call 911, 988, come back to this location, or go to the nearest ER. Pt verbalized understanding.   Flowsheet Row ED from 05/02/2024 in [facility] Most recent reading at 05/02/2024  4:18 PM ED from 05/02/2024 in [facility] Emergency Department at [facility] Most recent reading at 05/02/2024 12:20 PM ED from 05/01/2024 in [facility] Emergency Department at [facility] Most recent reading at 05/01/2024  5:17 PM  C-SSRS RISK CATEGORY Moderate Risk No Risk No Risk    Psychiatric Specialty Exam  Presentation  General Appearance:Casual  Eye Contact:Fair  Speech:Clear and Coherent  Speech Volume:Normal  Handedness:Right   Mood and Affect  Mood:Anxious; Depressed  Affect:Congruent   Thought Process  Thought Processes:Coherent  Descriptions of Associations:Intact  Orientation:Full (Time, Place and Person)  Thought Content:No data recorded   Hallucinations:None  Ideas of Reference:No data recorded Suicidal  Thoughts:No  Homicidal Thoughts:No   Sensorium  Memory:Immediate Fair  Judgment:Fair  Insight:Fair   Executive Functions  Concentration:Fair  Attention Span:Fair  Recall:Fair  Fund of Knowledge:Fair  Language:Fair   Psychomotor Activity  Psychomotor Activity:Normal   Assets  Assets:Desire for Improvement; Resilience   Sleep  Sleep:Fair  Number of hours: No data recorded  Physical Exam: Physical Exam Vitals and nursing note reviewed.  HENT:     Nose: Nose normal.  Eyes:     Pupils: Pupils are equal, round, and reactive to light.  Neurological:     General: No focal deficit present.     Mental Status: He is oriented to person, place, and time.    Review of Systems  Psychiatric/Behavioral:  Positive for depression. Negative for hallucinations, memory loss, substance abuse and suicidal ideas. The patient is nervous/anxious and has insomnia.   All other systems reviewed and are negative.  Blood pressure (!) 143/102, pulse (!) 110, temperature 98.4 F (36.9 C), temperature source Oral, resp. rate 17, SpO2 99%. There is no height or weight on file to calculate BMI.  Musculoskeletal: Strength & Muscle Tone: within normal limits Gait & Station: normal Patient leans: N/A   [facility] MSE Discharge Disposition for Follow up and Recommendations: Based on my evaluation the patient does not appear to have an emergency medical condition and can  be discharged with resources and follow up care in outpatient services for Medication Management and Individual Therapy   Donia Snell, NP 05/02/2024, 5:43 PM

## 2024-05-02 NOTE — ED Provider Notes (Signed)
 " [place] EMERGENCY DEPARTMENT AT [facility] Provider Note   CSN: [id number] Arrival date & time: 05/02/24  1212     Patient presents with: Anxiety   Christopher STUCKY is a 44 y.o. male.   44 year old male history of anxiety on Prozac who presents to the emergency department due to concerns for Ativan  withdrawals.  Patient reports that on December 23 was prescribed Ativan .  Was taking it 1 mg twice daily as prescribed.  No alcohol use.  No recreational benzodiazepine use.  Says that on the eighth of this month started Prozac by his PCP.  Was instructed to stop the Ativan .  Since then has been more anxious.  Says he occasionally feels palpitations.  Was seen here in the emergency department yesterday for chest pain and tachycardia and had a normal EKG, dimer, troponin, and basic labs.       Prior to Admission medications  Medication Sig Start Date End Date Taking? Authorizing Provider  ibuprofen  (ADVIL ) 800 MG tablet Take 1 tablet (800 mg total) by mouth 3 (three) times daily. 01/11/22   Rancour, Garnette, MD  loratadine (CLARITIN) 10 MG tablet Take 10 mg by mouth.    [provider]  metFORMIN  (GLUCOPHAGE ) 500 MG tablet Take 500 mg by mouth. Reported on 10/30/2015 10/04/15 10/03/16  [provider]  metFORMIN  (GLUMETZA ) 500 MG (MOD) 24 hr tablet Take 500 mg by mouth daily with breakfast.    [provider]  naproxen  (NAPROSYN ) 500 MG tablet Take 1 tablet (500 mg total) by mouth 2 (two) times daily. 05/15/23   Daralene Lonni BIRCH, PA-C  ondansetron  (ZOFRAN -ODT) 4 MG disintegrating tablet Take 1 tablet (4 mg total) by mouth every 8 (eight) hours as needed for nausea or vomiting. 01/11/22   Rancour, Garnette, MD  oxyCODONE -acetaminophen  (PERCOCET/ROXICET) 5-325 MG tablet Take 1 tablet by mouth every 8 (eight) hours as needed for severe pain. 01/11/22   Rancour, Garnette, MD  promethazine -dextromethorphan (PROMETHAZINE -DM) 6.25-15 MG/5ML syrup Take 5 mLs by  mouth 4 (four) times daily as needed for cough. 05/15/23   Daralene Lonni BIRCH, PA-C  tamsulosin  (FLOMAX ) 0.4 MG CAPS capsule Take 1 capsule (0.4 mg total) by mouth daily. 01/11/22   Rancour, Garnette, MD  fluticasone  (FLONASE ) 50 MCG/ACT nasal spray Place 2 sprays into both nostrils daily. Patient not taking: Reported on 12/14/2017 09/14/15 04/18/19  Isadora Krabbe D, PA    Allergies: Molds & smuts    Review of Systems  Updated Vital Signs BP (!) 144/101 (BP Location: Right Arm)   Pulse 98   Temp 97.9 F (36.6 C) (Oral)   Resp 15   SpO2 100%   Physical Exam Vitals and nursing note reviewed.  Constitutional:      General: He is not in acute distress.    Appearance: He is well-developed.  HENT:     Head: Normocephalic and atraumatic.     Right Ear: External ear normal.     Left Ear: External ear normal.     Nose: Nose normal.  Eyes:     Extraocular Movements: Extraocular movements intact.     Conjunctiva/sclera: Conjunctivae normal.     Pupils: Pupils are equal, round, and reactive to light.  Cardiovascular:     Rate and Rhythm: Normal rate and regular rhythm.     Heart sounds: Normal heart sounds.  Pulmonary:     Effort: Pulmonary effort is normal. No respiratory distress.     Breath sounds: Normal breath sounds.  Musculoskeletal:  Cervical back: Normal range of motion and neck supple.     Right lower leg: No edema.     Left lower leg: No edema.  Skin:    General: Skin is warm and dry.  Neurological:     Mental Status: He is alert. Mental status is at baseline.     Comments: No tremor or tongue fasciculations  Psychiatric:        Mood and Affect: Mood normal.        Behavior: Behavior normal.     (all labs ordered are listed, but only abnormal results are displayed) Labs Reviewed - No data to display  EKG: None  Radiology: DG Chest 2 View Result Date: 05/01/2024 EXAM: 2 VIEW(S) XRAY OF THE CHEST 05/01/2024 06:20:00 PM COMPARISON: Chest x-ray dated  05/15/2023. CLINICAL HISTORY: CP FINDINGS: LUNGS AND PLEURA: No focal pulmonary opacity. No pleural effusion. No pneumothorax. HEART AND MEDIASTINUM: No acute abnormality of the cardiac and mediastinal silhouettes. BONES AND SOFT TISSUES: No acute osseous abnormality. IMPRESSION: 1. No acute cardiopulmonary pathology. Electronically signed by: Greig Pique MD 05/01/2024 06:30 PM EST RP Workstation: [id number]     Procedures   Medications Ordered in the ED - No data to display                                   Medical Decision Making Risk Prescription drug management.   44 year old male with a history of anxiety who presents to the emergency department with anxiety and concerns for Ativan  withdrawals  Initial Ddx:  Benzodiazepine withdrawals, anxiety, panic attack, MI, PE, arrhythmia  MDM/Course:  Patient presents emergency department due to concerns for benzodiazepine withdrawals.  Was on a very low dose at only 1 mg twice daily starting on December 23 of 2025.  Has been more anxious since being changed over to Prozac recently.  On exam does not appear to be in acute distress.  Not in withdrawals.  Vital signs are reassuring.  Had a workup yesterday that included blood work as well as an EKG, troponin, D-dimer for chest pain.  It was all reassuring.  At this point in time it does not appear that he is on high enough benzodiazepine dose for long enough to develop a dependency.  Do not feel that he is in withdrawals from his lorazepam  at this point in time.  Suspected could potentially be a side effect of the Prozac.  Will have him follow-up with behavioral health urgent care and his outpatient psychiatric provider about this.   This patient presents to the ED for concern of complaints listed in HPI, this involves an extensive number of treatment options, and is a complaint that carries with it a high risk of complications and morbidity. Disposition including potential need for admission  considered.   Dispo: DC Home. Return precautions discussed including, but not limited to, those listed in the AVS. Allowed pt time to ask questions which were answered fully prior to dc.  I have reviewed the patients home medications and made adjustments as needed Records reviewed ED Visit Notes  Portions of this note were generated with Dragon dictation software. Dictation errors may occur despite best attempts at proofreading.     Final diagnoses:  Anxiety    ED Discharge Orders     None          Yolande Lamar BROCKS, MD 05/02/24 2024  "
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
## 2024-05-02 NOTE — Discharge Instructions (Signed)
 You were seen for anxiety and palpitations in the emergency department.   At home, please continue the Prozac.  You may take the Ativan  that you have once a day (1 pill) as needed for anxiety.    Check your MyChart online for the results of any tests that had not resulted by the time you left the emergency department.   Go to behavioral health urgent care to talk to them about your Prozac to see if it could be worsening your anxiety and if it needs to be adjusted.  Return immediately to the emergency department if you experience any of the following: Fainting, seizures, or any other concerning symptoms.    Thank you for visiting our Emergency Department. It was a pleasure taking care of you today.

## 2024-05-02 NOTE — Discharge Summary (Signed)
 Christopher Moore to be discharged Home per NP order. An After Visit Summary was printed and given to the patient. Patient escorted out and discharged home via private auto.  Christopher Moore  05/02/2024 5:31 PM

## 2024-05-02 NOTE — ED Triage Notes (Addendum)
 Pt bib [facility] coming from home with cc of anxiety. Pt called out EMS for anxiety attack. He states he was prescribed ativan  but has been taken off of it five days ago. Pt has no other complaints at this time. Denies SI/HI, calm and cooperative during triage. GCS 15.  EMS VS: 148/86 110 pulse 18 RR  100% RA 154 cbg

## 2024-05-17 ENCOUNTER — Encounter (HOSPITAL_BASED_OUTPATIENT_CLINIC_OR_DEPARTMENT_OTHER): Payer: Self-pay | Admitting: Pulmonary Disease

## 2024-05-17 DIAGNOSIS — G4733 Obstructive sleep apnea (adult) (pediatric): Secondary | ICD-10-CM

## 2024-08-01 ENCOUNTER — Ambulatory Visit: Admitting: Student in an Organized Health Care Education/Training Program
